# Patient Record
Sex: Female | Born: 1995 | Race: Black or African American | Hispanic: No | Marital: Single | Smoking: Never smoker
Health system: Southern US, Community
[De-identification: ages and names within clinical notes are randomized; demographics above are authoritative.]

## PROBLEM LIST (undated history)

## (undated) DIAGNOSIS — F419 Anxiety disorder, unspecified: Secondary | ICD-10-CM

---

## 2014-01-27 ENCOUNTER — Encounter (HOSPITAL_BASED_OUTPATIENT_CLINIC_OR_DEPARTMENT_OTHER): Payer: Self-pay | Admitting: Emergency Medicine

## 2014-01-27 ENCOUNTER — Emergency Department (HOSPITAL_BASED_OUTPATIENT_CLINIC_OR_DEPARTMENT_OTHER)
Admission: EM | Admit: 2014-01-27 | Discharge: 2014-01-27 | Disposition: A | Discharge status: Home / Self Care | Payer: Medicaid Other | Attending: Emergency Medicine | Admitting: Emergency Medicine

## 2014-01-27 DIAGNOSIS — N390 Urinary tract infection, site not specified: Secondary | ICD-10-CM

## 2014-01-27 DIAGNOSIS — R3915 Urgency of urination: Secondary | ICD-10-CM | POA: Diagnosis present

## 2014-01-27 DIAGNOSIS — Z3202 Encounter for pregnancy test, result negative: Secondary | ICD-10-CM | POA: Diagnosis not present

## 2014-01-27 DIAGNOSIS — R109 Unspecified abdominal pain: Secondary | ICD-10-CM | POA: Diagnosis not present

## 2014-01-27 LAB — PREGNANCY, URINE: PREG TEST UR: NEGATIVE

## 2014-01-27 LAB — URINALYSIS, ROUTINE W REFLEX MICROSCOPIC
Bilirubin Urine: NEGATIVE
Glucose, UA: NEGATIVE mg/dL
Ketones, ur: 15 mg/dL — AB
NITRITE: NEGATIVE
PROTEIN: 100 mg/dL — AB
Specific Gravity, Urine: 1.026 (ref 1.005–1.030)
Urobilinogen, UA: 1 mg/dL (ref 0.0–1.0)
pH: 7 (ref 5.0–8.0)

## 2014-01-27 LAB — URINE MICROSCOPIC-ADD ON

## 2014-01-27 MED ORDER — NITROFURANTOIN MONOHYD MACRO 100 MG PO CAPS: 100.0000 mg | ORAL_CAPSULE | Freq: Two times a day (BID) | ORAL | Status: DC

## 2014-01-27 MED ORDER — NITROFURANTOIN MONOHYD MACRO 100 MG PO CAPS
100.0000 mg | ORAL_CAPSULE | Freq: Once | ORAL | Status: AC
  Administered 2014-01-27: 100 mg via ORAL
  Filled 2014-01-27: qty 1

## 2014-01-27 NOTE — ED Notes (Signed)
MD at bedside. 

## 2014-01-27 NOTE — ED Provider Notes (Addendum)
CSN: [id number]     Arrival date & time 01/27/14  0058 History   First MD Initiated Contact with Patient 01/27/14 0111     Chief Complaint  Patient presents with  . Urinary Urgency     (Consider location/radiation/quality/duration/timing/severity/associated sxs/prior Treatment) Patient is a 18 y.o. female presenting with dysuria. The history is provided by the patient.  Dysuria Pain quality:  Burning and shooting Pain severity:  Moderate Onset quality:  Gradual Duration:  2 days Timing:  Constant Progression:  Worsening Chronicity:  New Recent urinary tract infections: no   Relieved by:  Nothing Exacerbated by: urinating. Ineffective treatments:  None tried Urinary symptoms: frequent urination and hesitancy   Urinary symptoms: no discolored urine and no hematuria   Associated symptoms: abdominal pain   Associated symptoms: no fever, no flank pain, no genital lesions, no nausea, no vaginal discharge and no vomiting   Risk factors: sexually active   Risk factors: no hx of pyelonephritis and no sexually transmitted infections     History reviewed. No pertinent past medical history. History reviewed. No pertinent past surgical history. No family history on file. History  Substance Use Topics  . Smoking status: Never Smoker   . Smokeless tobacco: Not on file  . Alcohol Use: No   OB History    No data available     Review of Systems  Constitutional: Negative for fever.  Gastrointestinal: Positive for abdominal pain. Negative for nausea and vomiting.  Genitourinary: Positive for dysuria. Negative for flank pain and vaginal discharge.  All other systems reviewed and are negative.     Allergies  Review of patient's allergies indicates no known allergies.  Home Medications   Prior to Admission medications   Not on File   BP 127/75 mmHg  Pulse 102  Temp(Src) 98.1 F (36.7 C) (Oral)  Resp 20  Ht 5\' 5"  (1.651 m)  Wt 198 lb (89.812 kg)  BMI 32.95 kg/m2  SpO2  100%  LMP 01/07/2014 (Exact Date) Physical Exam  Constitutional: She is oriented to person, place, and time. She appears well-developed and well-nourished. No distress.  HENT:  Head: Normocephalic and atraumatic.  Eyes: EOM are normal. Pupils are equal, round, and reactive to light.  Cardiovascular: Normal rate, regular rhythm, normal heart sounds and intact distal pulses.  Exam reveals no friction rub.   No murmur heard. Pulmonary/Chest: Effort normal and breath sounds normal. She has no wheezes. She has no rales.  Abdominal: Soft. Bowel sounds are normal. She exhibits no distension. There is tenderness in the suprapubic area. There is no rebound and no guarding.  No flank pain  Musculoskeletal: Normal range of motion. She exhibits no tenderness.  No edema  Neurological: She is alert and oriented to person, place, and time. No cranial nerve deficit.  Skin: Skin is warm and dry. No rash noted.  Psychiatric: She has a normal mood and affect. Her behavior is normal.  Nursing note and vitals reviewed.   ED Course  Procedures (including critical care time) Labs Review Labs Reviewed  URINALYSIS, ROUTINE W REFLEX MICROSCOPIC - Abnormal; Notable for the following:    Color, Urine AMBER (*)    APPearance TURBID (*)    Hgb urine dipstick LARGE (*)    Ketones, ur 15 (*)    Protein, ur 100 (*)    Leukocytes, UA LARGE (*)    All other components within normal limits  URINE MICROSCOPIC-ADD ON - Abnormal; Notable for the following:    Squamous Epithelial /  LPF FEW (*)    Bacteria, UA MANY (*)    All other components within normal limits  PREGNANCY, URINE    Imaging Review No results found.   EKG Interpretation None      MDM   Final diagnoses:  UTI (lower urinary tract infection)    Patient with symptoms consistent with a UTI without systemic symptoms or concerns for pyelonephritis or nephrolithiasis. Patient does not have recurrent UTIs and no history did a concerning for STD.  Patient's urine is consistent with the UTI we'll treat with Leander RamsMacrobid    Willman Cuny, MD 01/27/14 [id number]  Gwyneth SproutWhitney Tammatha Cobb, MD 01/27/14 [id number]

## 2014-01-27 NOTE — ED Notes (Signed)
Bil low abd pain.  Urinary urgency but when she goes either nothing comes out or very little.  Denies dysuria.

## 2014-03-31 ENCOUNTER — Encounter (HOSPITAL_BASED_OUTPATIENT_CLINIC_OR_DEPARTMENT_OTHER): Payer: Self-pay | Admitting: Emergency Medicine

## 2014-03-31 ENCOUNTER — Emergency Department (HOSPITAL_BASED_OUTPATIENT_CLINIC_OR_DEPARTMENT_OTHER)
Admission: EM | Admit: 2014-03-31 | Discharge: 2014-03-31 | Disposition: A | Discharge status: Home / Self Care | Payer: Medicaid Other | Attending: Emergency Medicine | Admitting: Emergency Medicine

## 2014-03-31 DIAGNOSIS — R110 Nausea: Secondary | ICD-10-CM | POA: Diagnosis present

## 2014-03-31 DIAGNOSIS — Z792 Long term (current) use of antibiotics: Secondary | ICD-10-CM | POA: Diagnosis not present

## 2014-03-31 DIAGNOSIS — R197 Diarrhea, unspecified: Secondary | ICD-10-CM

## 2014-03-31 MED ORDER — ONDANSETRON 4 MG PO TBDP
4.0000 mg | ORAL_TABLET | Freq: Once | ORAL | Status: AC
  Administered 2014-03-31: 4 mg via ORAL
  Filled 2014-03-31: qty 1

## 2014-03-31 MED ORDER — ONDANSETRON 4 MG PO TBDP: ORAL_TABLET | ORAL | Status: DC

## 2014-03-31 NOTE — ED Notes (Signed)
Pt states last night around & she started having N/D

## 2014-03-31 NOTE — Discharge Instructions (Signed)

## 2014-03-31 NOTE — ED Provider Notes (Signed)
CSN: [id number]     Arrival date & time 03/31/14  1934 History   This chart was scribed for Mirian Mo, MD by Charline Bills, ED Scribe. The patient was seen in room MH04/MH04. Patient's care was started at 7:47 PM.   Chief Complaint  Patient presents with  . Nausea  . Diarrhea   The history is provided by the patient. No language interpreter was used.   HPI Comments: Claudia Webb is a 18 y.o. female who presents to the Emergency Department complaining of multiple episodes of diarrhea since 7 PM last night. Pt describes the quality as watery. She reports associated nausea that she describes as constant. Pt denies vomiting, fever, abdominal pain, dysuria, vaginal bleeding, vaginal discharge. Pt also denies change in food. No sick contacts.  History reviewed. No pertinent past medical history. History reviewed. No pertinent past surgical history. History reviewed. No pertinent family history. History  Substance Use Topics  . Smoking status: Never Smoker   . Smokeless tobacco: Not on file  . Alcohol Use: No   OB History    No data available     Review of Systems  Constitutional: Negative for fever.  Gastrointestinal: Positive for nausea and diarrhea. Negative for vomiting and abdominal pain.  Genitourinary: Negative for dysuria, vaginal bleeding and vaginal discharge.  All other systems reviewed and are negative.  Allergies  Review of patient's allergies indicates no known allergies.  Home Medications   Prior to Admission medications   Medication Sig Start Date End Date Taking? Authorizing Provider  nitrofurantoin, macrocrystal-monohydrate, (MACROBID) 100 MG capsule Take 1 capsule (100 mg total) by mouth 2 (two) times daily. 01/27/14   Gwyneth Sprout, MD  ondansetron (ZOFRAN ODT) 4 MG disintegrating tablet  ODT q4 hours prn nausea/vomit 03/31/14   Mirian Mo, MD   BP 130/80 mmHg  Pulse 83  Temp(Src) 98.6 F (37 C) (Oral)  Resp 18  Ht  (1.626 m)  Wt 190 lb  (86.183 kg)  BMI 32.60 kg/m2  SpO2 100%  LMP 03/29/2014 Physical Exam  Constitutional: She is oriented to person, place, and time. She appears well-developed and well-nourished.  HENT:  Head: Normocephalic and atraumatic.  Right Ear: External ear normal.  Left Ear: External ear normal.  Eyes: Conjunctivae and EOM are normal. Pupils are equal, round, and reactive to light.  Neck: Normal range of motion. Neck supple.  Cardiovascular: Normal rate, regular rhythm, normal heart sounds and intact distal pulses.   Pulmonary/Chest: Effort normal and breath sounds normal.  Abdominal: Soft. Bowel sounds are normal. There is no tenderness.  Musculoskeletal: Normal range of motion.  Neurological: She is alert and oriented to person, place, and time.  Skin: Skin is warm and dry.  Vitals reviewed.   ED Course  Procedures (including critical care time) DIAGNOSTIC STUDIES: Oxygen Saturation is 100% on RA, normal by my interpretation.    COORDINATION OF CARE: 7:51-Discussed treatment plan which includes Zofran and return precautions with pt at bedside and pt agreed to plan.   Labs Review Labs Reviewed - No data to display  Imaging Review No results found.   EKG Interpretation None      MDM   Final diagnoses:  Nausea  Diarrhea  Diarrhea in adult patient    19 y.o. female without pertinent PMH presents with diarrhea and nausea x 1 day.  No vomiting, sick contacts, other concerning historical features. On arrival vital signs and physical exam as above. No abdominal tenderness. Patient is tolerating by mouth intake  without difficulty. Likely etiology viral gastroenteritis. Discharged home in stable condition with strict return precautions..    I have reviewed all laboratory and imaging studies if ordered as above  1. Nausea   2. Diarrhea   3. Diarrhea in adult patient       I personally performed the services described in this documentation, which was scribed in my presence. The  recorded information has been reviewed and is accurate.    Mirian MoMatthew Gentry, MD 03/31/14 2009

## 2014-05-09 ENCOUNTER — Emergency Department (HOSPITAL_BASED_OUTPATIENT_CLINIC_OR_DEPARTMENT_OTHER)
Admission: EM | Admit: 2014-05-09 | Discharge: 2014-05-09 | Disposition: A | Discharge status: Home / Self Care | Payer: Medicaid Other | Attending: Emergency Medicine | Admitting: Emergency Medicine

## 2014-05-09 ENCOUNTER — Encounter (HOSPITAL_BASED_OUTPATIENT_CLINIC_OR_DEPARTMENT_OTHER): Payer: Self-pay | Admitting: Emergency Medicine

## 2014-05-09 DIAGNOSIS — J029 Acute pharyngitis, unspecified: Secondary | ICD-10-CM | POA: Diagnosis present

## 2014-05-09 DIAGNOSIS — Z79899 Other long term (current) drug therapy: Secondary | ICD-10-CM | POA: Insufficient documentation

## 2014-05-09 DIAGNOSIS — Z793 Long term (current) use of hormonal contraceptives: Secondary | ICD-10-CM | POA: Insufficient documentation

## 2014-05-09 LAB — RAPID STREP SCREEN (MED CTR MEBANE ONLY): Streptococcus, Group A Screen (Direct): NEGATIVE

## 2014-05-09 MED ORDER — IBUPROFEN 600 MG PO TABS: 600.0000 mg | ORAL_TABLET | Freq: Four times a day (QID) | ORAL | Status: DC | PRN

## 2014-05-09 MED ORDER — IBUPROFEN 800 MG PO TABS
800.0000 mg | ORAL_TABLET | Freq: Once | ORAL | Status: AC
  Administered 2014-05-09: 800 mg via ORAL
  Filled 2014-05-09: qty 1

## 2014-05-09 NOTE — Discharge Instructions (Signed)
Please read and follow all provided instructions.  Your diagnoses today include:  1. Pharyngitis     Tests performed today include:  Strep test: was negative for strep throat  Strep culture: you will be notified if this comes back positive  Vital signs. See below for your results today.   Medications prescribed:   Ibuprofen (Motrin, Advil) - anti-inflammatory pain medication  Do not exceed 600mg  ibuprofen every 6 hours, take with food  You have been prescribed an anti-inflammatory medication or NSAID. Take with food. Take smallest effective dose for the shortest duration needed for your pain. Stop taking if you experience stomach pain or vomiting.    Home care instructions:  Please read the educational materials provided and follow any instructions contained in this packet.  Follow-up instructions: Please follow-up with your primary care provider as needed for further evaluation of your symptoms.  Return instructions:   Please return to the Emergency Department if you experience worsening symptoms.   Return if you have worsening problems swallowing, your neck becomes swollen, you cannot swallow your saliva or your voice becomes muffled.   Return with high persistent fever, persistent vomiting, or if you have trouble breathing.   Please return if you have any other emergent concerns.  Additional Information:  Your vital signs today were: BP 119/65 mmHg   Pulse 83   Temp(Src) 98.8 F (37.1 C) (Oral)   Resp 16   SpO2 100%   LMP 04/16/2014 If your blood pressure (BP) was elevated above 135/85 this visit, please have this repeated by your doctor within one month. --------------

## 2014-05-09 NOTE — ED Provider Notes (Signed)
CSN: [id number]     Arrival date & time 05/09/14  2121 History   First MD Initiated Contact with Patient 05/09/14 2118     Chief Complaint  Patient presents with  . Sore Throat     (Consider location/radiation/quality/duration/timing/severity/associated sxs/prior Treatment) HPI Comments: Patient presents with approximately 12 hour history of sore throat. Patient states that her throat is more sore on the right side. She has had nasal congestion recently but denies other symptoms including cough. No fever. Patient has not taken any medications for this prior to arrival.  Patient is a 19 y.o. female presenting with pharyngitis. The history is provided by the patient.  Sore Throat Associated symptoms include congestion and a sore throat. Pertinent negatives include no abdominal pain, chills, coughing, fatigue, fever, headaches, myalgias, nausea, rash or vomiting.    History reviewed. No pertinent past medical history. History reviewed. No pertinent past surgical history. History reviewed. No pertinent family history. History  Substance Use Topics  . Smoking status: Never Smoker   . Smokeless tobacco: Never Used  . Alcohol Use: No   OB History    No data available     Review of Systems  Constitutional: Negative for fever, chills and fatigue.  HENT: Positive for congestion and sore throat. Negative for ear pain, rhinorrhea and sinus pressure.   Eyes: Negative for redness.  Respiratory: Negative for cough and wheezing.   Gastrointestinal: Negative for nausea, vomiting, abdominal pain and diarrhea.  Genitourinary: Negative for dysuria.  Musculoskeletal: Negative for myalgias and neck stiffness.  Skin: Negative for rash.  Neurological: Negative for headaches.  Hematological: Positive for adenopathy.      Allergies  Review of patient's allergies indicates no known allergies.  Home Medications   Prior to Admission medications   Medication Sig Start Date End Date Taking?  Authorizing Provider  nitrofurantoin, macrocrystal-monohydrate, (MACROBID) 100 MG capsule Take 1 capsule (100 mg total) by mouth 2 (two) times daily. 01/27/14   Gwyneth SproutWhitney Plunkett, MD  ondansetron (ZOFRAN ODT) 4 MG disintegrating tablet 4mg  ODT q4 hours prn nausea/vomit 03/31/14   Mirian MoMatthew Gentry, MD   BP 119/65 mmHg  Pulse 83  Temp(Src) 98.8 F (37.1 C) (Oral)  Resp 16  SpO2 100%  LMP 04/16/2014   Physical Exam  Constitutional: She appears well-developed and well-nourished.  HENT:  Head: Normocephalic and atraumatic.  Right Ear: Tympanic membrane, external ear and ear canal normal.  Left Ear: Tympanic membrane, external ear and ear canal normal.  Nose: Nose normal. No mucosal edema or rhinorrhea.  Mouth/Throat: Uvula is midline and mucous membranes are normal. Mucous membranes are not dry. No oral lesions. No trismus in the jaw. No uvula swelling. Posterior oropharyngeal erythema present. No oropharyngeal exudate, posterior oropharyngeal edema or tonsillar abscesses.  Eyes: Conjunctivae are normal. Right eye exhibits no discharge. Left eye exhibits no discharge.  Neck: Normal range of motion. Neck supple.  Cardiovascular: Normal rate, regular rhythm and normal heart sounds.   Pulmonary/Chest: Effort normal and breath sounds normal. No respiratory distress. She has no wheezes. She has no rales.  Abdominal: Soft. There is no tenderness.  Lymphadenopathy:    She has cervical adenopathy.  Neurological: She is alert.  Skin: Skin is warm and dry.  Psychiatric: She has a normal mood and affect.  Nursing note and vitals reviewed.   ED Course  Procedures (including critical care time) Labs Review Labs Reviewed  RAPID STREP SCREEN    Imaging Review No results found.   EKG Interpretation None  9:59 PM Patient seen and examined. Work-up initiated. Medications ordered.   Vital signs reviewed and are as follows: BP 119/65 mmHg  Pulse 83  Temp(Src) 98.8 F (37.1 C) (Oral)   Resp 16  SpO2 100%  LMP 04/16/2014  10:18 PM Patient informed of negative strep result. Patient counseled on supportive care for viral URI and s/s to return including worsening symptoms, persistent fever, persistent vomiting, or if they have any other concerns. Urged to see PCP if symptoms persist for more than 3 days. Patient verbalizes understanding and agrees with plan.    MDM   Final diagnoses:  Pharyngitis    [facility] 2/4, strep test negative. Patient counseled on supportive treatment for likely viral pharyngitis. No sign of peritonsillar abscess. Do not suspect retropharyngeal abscess. Patient appears well, nontoxic. Discharged home with NSAIDs and symptomatic treatment.    Renne Crigler, PA-C 05/09/14 2219  Rolland Porter, MD 05/10/14 2123

## 2014-05-09 NOTE — ED Notes (Signed)
Pt alert, NAD, calm, interactive, skin W&D, resps e/u, no dyspnea noted, c/o sore throat, onset this am, (denies: cough, congestion, cold sx, sob, fever, nvd, dizziness, earache or facial pain or other sx), no meds PTA, speech altered, tonsils swollen.

## 2014-05-09 NOTE — ED Notes (Signed)
NAD, calm, alert, interactive, [facility] given [facility] with motrin.

## 2014-05-09 NOTE — ED Notes (Signed)
Pt report sore throat that worsened through out the day onset this AM

## 2014-05-09 NOTE — ED Notes (Signed)
Given snack. 

## 2014-05-11 LAB — CULTURE, GROUP A STREP

## 2014-06-14 ENCOUNTER — Encounter (HOSPITAL_BASED_OUTPATIENT_CLINIC_OR_DEPARTMENT_OTHER): Payer: Self-pay

## 2014-06-14 ENCOUNTER — Emergency Department (HOSPITAL_BASED_OUTPATIENT_CLINIC_OR_DEPARTMENT_OTHER)
Admission: EM | Admit: 2014-06-14 | Discharge: 2014-06-14 | Disposition: A | Discharge status: Home / Self Care | Payer: Medicaid Other | Attending: Emergency Medicine | Admitting: Emergency Medicine

## 2014-06-14 DIAGNOSIS — Z3490 Encounter for supervision of normal pregnancy, unspecified, unspecified trimester: Secondary | ICD-10-CM

## 2014-06-14 DIAGNOSIS — Z331 Pregnant state, incidental: Secondary | ICD-10-CM | POA: Insufficient documentation

## 2014-06-14 DIAGNOSIS — N390 Urinary tract infection, site not specified: Secondary | ICD-10-CM | POA: Diagnosis not present

## 2014-06-14 DIAGNOSIS — R1013 Epigastric pain: Secondary | ICD-10-CM | POA: Diagnosis present

## 2014-06-14 DIAGNOSIS — K219 Gastro-esophageal reflux disease without esophagitis: Secondary | ICD-10-CM | POA: Insufficient documentation

## 2014-06-14 DIAGNOSIS — Z3201 Encounter for pregnancy test, result positive: Secondary | ICD-10-CM | POA: Diagnosis not present

## 2014-06-14 LAB — URINALYSIS, ROUTINE W REFLEX MICROSCOPIC
Glucose, UA: NEGATIVE mg/dL
Hgb urine dipstick: NEGATIVE
Ketones, ur: 15 mg/dL — AB
Nitrite: NEGATIVE
PH: 6 (ref 5.0–8.0)
Protein, ur: NEGATIVE mg/dL
SPECIFIC GRAVITY, URINE: 1.028 (ref 1.005–1.030)
Urobilinogen, UA: 1 mg/dL (ref 0.0–1.0)

## 2014-06-14 LAB — URINE MICROSCOPIC-ADD ON

## 2014-06-14 LAB — PREGNANCY, URINE: Preg Test, Ur: POSITIVE — AB

## 2014-06-14 MED ORDER — CEPHALEXIN 250 MG PO CAPS: 250.0000 mg | ORAL_CAPSULE | Freq: Four times a day (QID) | ORAL | Status: DC

## 2014-06-14 NOTE — ED Notes (Signed)
Patient here with epigastric pain x 3 days, reports that the pain is worse when she lays down. Nausea with same

## 2014-06-14 NOTE — ED Provider Notes (Signed)
CSN: [id number]     Arrival date & time 06/14/14  1524 History  This chart was scribed for Margarita Grizzle, MD by Abel Presto, ED Scribe. This patient was seen in room MH06/MH06 and the patient's care was started at 5:38 PM.    Chief Complaint  Patient presents with  . Abdominal Pain     Patient is a 19 y.o. female presenting with abdominal pain. The history is provided by the patient. No language interpreter was used.  Abdominal Pain Associated symptoms: no dysuria    HPI Comments: Claudia Webb is a 19 y.o. female who presents to the Emergency Department complaining of intermittent epigastric pain with onset 3 days ago. Pt states it does not feel like reflux. Pt note laying down aggravates the pain.  Pt denies any changes in diet. Pt's LNMP was 02/23. G/P/A is 1/1/0. Pt was on depo, noting last shot was last year.  Pt is not a smoker and denies EtOH use. Pt denies dysuria, urinary frequency, and unexpected changes in weight.   History reviewed. No pertinent past medical history. History reviewed. No pertinent past surgical history. No family history on file. History  Substance Use Topics  . Smoking status: Never Smoker   . Smokeless tobacco: Never Used  . Alcohol Use: No   OB History    No data available     Review of Systems  Constitutional: Negative for unexpected weight change.  Gastrointestinal: Positive for abdominal pain.  Genitourinary: Negative for dysuria and frequency.  All other systems reviewed and are negative.     Allergies  Review of patient's allergies indicates no known allergies.  Home Medications   Prior to Admission medications   Not on File   BP 115/61 mmHg  Pulse 96  Temp(Src) 98.7 F (37.1 C) (Oral)  Resp 18  Ht  (1.626 m)  Wt 190 lb (86.183 kg)  BMI 32.60 kg/m2  SpO2 100%  LMP 05/24/2014 Physical Exam  Constitutional: She is oriented to person, place, and time. She appears well-developed and well-nourished. No distress.  HENT:   Head: Normocephalic and atraumatic.  Right Ear: External ear normal.  Left Ear: External ear normal.  Nose: Nose normal.  Mouth/Throat: Oropharynx is clear and moist.  Eyes: Conjunctivae and EOM are normal. Pupils are equal, round, and reactive to light.  Neck: Normal range of motion. Neck supple.  Cardiovascular: Normal rate and regular rhythm.   Pulmonary/Chest: Effort normal and breath sounds normal.  Abdominal: Soft. She exhibits no distension and no mass. There is no tenderness. There is no rebound and no guarding.  Musculoskeletal: Normal range of motion.  Neurological: She is alert and oriented to person, place, and time.  Skin: Skin is warm and dry.  Psychiatric: She has a normal mood and affect. Her behavior is normal. Judgment and thought content normal.  Nursing note and vitals reviewed.   ED Course  Procedures (including critical care time) DIAGNOSTIC STUDIES: Oxygen Saturation is 100% on room air, normal by my interpretation.    COORDINATION OF CARE: 5:52 PM Discussed treatment plan with patient at beside including Abx for UTI, the patient agrees with the plan and has no further questions at this time.   Labs Review Labs Reviewed  URINALYSIS, ROUTINE W REFLEX MICROSCOPIC - Abnormal; Notable for the following:    Bilirubin Urine SMALL (*)    Ketones, ur 15 (*)    Leukocytes, UA SMALL (*)    All other components within normal limits  PREGNANCY, URINE -  Abnormal; Notable for the following:    Preg Test, Ur POSITIVE (*)    All other components within normal limits  URINE MICROSCOPIC-ADD ON - Abnormal; Notable for the following:    Bacteria, UA MANY (*)    All other components within normal limits    Imaging Review No results found.   EKG Interpretation None      Results for orders placed or performed during the hospital encounter of 06/14/14  Urinalysis, Routine w reflex microscopic  Result Value Ref Range   Color, Urine YELLOW YELLOW   APPearance CLEAR  CLEAR   Specific Gravity, Urine 1.028 1.005 - 1.030   pH 6.0 5.0 - 8.0   Glucose, UA NEGATIVE NEGATIVE mg/dL   Hgb urine dipstick NEGATIVE NEGATIVE   Bilirubin Urine SMALL (A) NEGATIVE   Ketones, ur 15 (A) NEGATIVE mg/dL   Protein, ur NEGATIVE NEGATIVE mg/dL   Urobilinogen, UA 1.0 0.0 - 1.0 mg/dL   Nitrite NEGATIVE NEGATIVE   Leukocytes, UA SMALL (A) NEGATIVE  Pregnancy, urine  Result Value Ref Range   Preg Test, Ur POSITIVE (A) NEGATIVE  Urine microscopic-add on  Result Value Ref Range   Squamous Epithelial / LPF RARE RARE   WBC, UA 3-6 <3 WBC/hpf   Bacteria, UA MANY (A) RARE   Urine-Other MUCOUS PRESENT    No results found.   MDM   Final diagnoses:  Gastroesophageal reflux disease, esophagitis presence not specified  Pregnant  UTI (lower urinary tract infection)    I personally performed the services described in this documentation, which was scribed in my presence. The recorded information has been reviewed and considered.       Margarita Grizzleanielle Demitris Pokorny, MD 06/14/14 1800

## 2014-06-14 NOTE — Discharge Instructions (Signed)
Heartburn During Pregnancy  °Heartburn is a burning sensation in the chest caused by stomach acid backing up into the esophagus. Heartburn is common in pregnancy because a certain hormone (progesterone) is released when a woman is pregnant. The progesterone hormone may relax the valve that separates the esophagus from the stomach. This allows acid to go up into the esophagus, causing heartburn. Heartburn may also happen in pregnancy because the enlarging uterus pushes up on the stomach, which pushes more acid into the esophagus. This is especially true in the later stages of pregnancy. Heartburn problems usually go away after giving birth. °CAUSES  °Heartburn is caused by stomach acid backing up into the esophagus. During pregnancy, this may result from various things, including:  °· The progesterone hormone. °· Changing hormone levels. °· The growing uterus pushing stomach acid upward. °· Large meals. °· Certain foods and drinks. °· Exercise. °· Increased acid production. °SIGNS AND SYMPTOMS  °· Burning pain in the chest or lower throat. °· Bitter taste in the mouth. °· Coughing. °DIAGNOSIS  °Your health care provider will typically diagnose heartburn by taking a careful history of your concern. Blood tests may be done to check for a certain type of bacteria that is associated with heartburn. Sometimes, heartburn is diagnosed by prescribing a heartburn medicine to see if the symptoms improve. In some cases, a procedure called an endoscopy may be done. In this procedure, a tube with a light and a camera on the end (endoscope) is used to examine the esophagus and the stomach. °TREATMENT  °Treatment will vary depending on the severity of your symptoms. Your health care provider may recommend: °· Over-the-counter medicines (antacids, acid reducers) for mild heartburn. °· Prescription medicines to decrease stomach acid or to protect your stomach lining. °· Certain changes in your diet. °· Elevating the head of your bed  by putting blocks under the legs. This helps prevent stomach acid from backing up into the esophagus when you are lying down. °HOME CARE INSTRUCTIONS  °· Only take over-the-counter or prescription medicines as directed by your health care provider. °· Raise the head of your bed by putting blocks under the legs if instructed to do so by your health care provider. Sleeping with more pillows is not effective because it only changes the position of your head. °· Do not exercise right after eating. °· Avoid eating 2-3 hours before bed. Do not lie down right after eating. °· Eat small meals throughout the day instead of three large meals. °· Identify foods and beverages that make your symptoms worse and avoid them. Foods you may want to avoid include: °¨ Peppers. °¨ Chocolate. °¨ High-fat foods, including fried foods. °¨ Spicy foods. °¨ Garlic and onions. °¨ Citrus fruits, including oranges, grapefruit, lemons, and limes. °¨ Food containing tomatoes or tomato products. °¨ Mint. °¨ Carbonated and caffeinated drinks. °¨ Vinegar. °SEEK MEDICAL CARE IF: °· You have abdominal pain of any kind. °· You feel burning in your upper abdomen or chest, especially after eating or lying down. °· You have nausea and vomiting. °· Your stomach feels upset after you eat. °SEEK IMMEDIATE MEDICAL CARE IF:  °· You have severe chest pain that goes down your arm or into your jaw or neck. °· You feel sweaty, dizzy, or light-headed. °· You become short of breath. °· You vomit blood. °· You have difficulty or pain with swallowing. °· You have bloody or black, tarry stools. °· You have episodes of heartburn more than 3 times a   week, for more than 2 weeks. MAKE SURE YOU:  Understand these instructions.  Will watch your condition.  Will get help right away if you are not doing well or get worse. Document Released: 03/10/2000 Document Revised: 03/18/2013 Document Reviewed: 10/30/2012 [facility] Patient Information 2015 [place], [place]. This  information is not intended to replace advice given to you by your health care provider. Make sure you discuss any questions you have with your health care provider. Gastroesophageal Reflux Disease, Adult Gastroesophageal reflux disease (GERD) happens when acid from your stomach flows up into the esophagus. When acid comes in contact with the esophagus, the acid causes soreness (inflammation) in the esophagus. Over time, GERD may create small holes (ulcers) in the lining of the esophagus. CAUSES   Increased body weight. This puts pressure on the stomach, making acid rise from the stomach into the esophagus.  Smoking. This increases acid production in the stomach.  Drinking alcohol. This causes decreased pressure in the lower esophageal sphincter (valve or ring of muscle between the esophagus and stomach), allowing acid from the stomach into the esophagus.  Late evening meals and a full stomach. This increases pressure and acid production in the stomach.  A malformed lower esophageal sphincter. Sometimes, no cause is found. SYMPTOMS   Burning pain in the lower part of the mid-chest behind the breastbone and in the mid-stomach area. This may occur twice a week or more often.  Trouble swallowing.  Sore throat.  Dry cough.  Asthma-like symptoms including chest tightness, shortness of breath, or wheezing. DIAGNOSIS  Your caregiver may be able to diagnose GERD based on your symptoms. In some cases, X-rays and other tests may be done to check for complications or to check the condition of your stomach and esophagus. TREATMENT  Your caregiver may recommend over-the-counter or prescription medicines to help decrease acid production. Ask your caregiver before starting or adding any new medicines.  HOME CARE INSTRUCTIONS   Change the factors that you can control. Ask your caregiver for guidance concerning weight loss, quitting smoking, and alcohol consumption.  Avoid foods and drinks that make  your symptoms worse, such as:  Caffeine or alcoholic drinks.  Chocolate.  Peppermint or mint flavorings.  Garlic and onions.  Spicy foods.  Citrus fruits, such as oranges, lemons, or limes.  Tomato-based foods such as sauce, chili, salsa, and pizza.  Fried and fatty foods.  Avoid lying down for the 3 hours prior to your bedtime or prior to taking a nap.  Eat small, frequent meals instead of large meals.  Wear loose-fitting clothing. Do not wear anything tight around your waist that causes pressure on your stomach.  Raise the head of your bed 6 to 8 inches with wood blocks to help you sleep. Extra pillows will not help.  Only take over-the-counter or prescription medicines for pain, discomfort, or fever as directed by your caregiver.  Do not take aspirin, ibuprofen, or other nonsteroidal anti-inflammatory drugs (NSAIDs). SEEK IMMEDIATE MEDICAL CARE IF:   You have pain in your arms, neck, jaw, teeth, or back.  Your pain increases or changes in intensity or duration.  You develop nausea, vomiting, or sweating (diaphoresis).  You develop shortness of breath, or you faint.  Your vomit is green, yellow, black, or looks like coffee grounds or blood.  Your stool is red, bloody, or black. These symptoms could be signs of other problems, such as heart disease, gastric bleeding, or esophageal bleeding. MAKE SURE YOU:   Understand these instructions.  Will  watch your condition.  Will get help right away if you are not doing well or get worse. Document Released: 12/21/2004 Document Revised: 06/05/2011 Document Reviewed: 09/30/2010 [facility] Patient Information 2015 [place]. This information is not intended to replace advice given to you by your health care provider. Make sure you discuss any questions you have with your health care provider. Urinary Tract Infection Urinary tract infections (UTIs) can develop anywhere along your urinary tract. Your urinary tract is your  body's drainage system for removing wastes and extra water. Your urinary tract includes two kidneys, two ureters, a bladder, and a urethra. Your kidneys are a pair of bean-shaped organs. Each kidney is about the size of your fist. They are located below your ribs, one on each side of your spine. CAUSES Infections are caused by microbes, which are microscopic organisms, including fungi, viruses, and bacteria. These organisms are so small that they can only be seen through a microscope. Bacteria are the microbes that most commonly cause UTIs. SYMPTOMS  Symptoms of UTIs may vary by age and gender of the patient and by the location of the infection. Symptoms in young women typically include a frequent and intense urge to urinate and a painful, burning feeling in the bladder or urethra during urination. Older women and men are more likely to be tired, shaky, and weak and have muscle aches and abdominal pain. A fever may mean the infection is in your kidneys. Other symptoms of a kidney infection include pain in your back or sides below the ribs, nausea, and vomiting. DIAGNOSIS To diagnose a UTI, your caregiver will ask you about your symptoms. Your caregiver also will ask to provide a urine sample. The urine sample will be tested for bacteria and white blood cells. White blood cells are made by your body to help fight infection. TREATMENT  Typically, UTIs can be treated with medication. Because most UTIs are caused by a bacterial infection, they usually can be treated with the use of antibiotics. The choice of antibiotic and length of treatment depend on your symptoms and the type of bacteria causing your infection. HOME CARE INSTRUCTIONS  If you were prescribed antibiotics, take them exactly as your caregiver instructs you. Finish the medication even if you feel better after you have only taken some of the medication.  Drink enough water and fluids to keep your urine clear or pale yellow.  Avoid caffeine,  tea, and carbonated beverages. They tend to irritate your bladder.  Empty your bladder often. Avoid holding urine for long periods of time.  Empty your bladder before and after sexual intercourse.  After a bowel movement, women should cleanse from front to back. Use each tissue only once. SEEK MEDICAL CARE IF:   You have back pain.  You develop a fever.  Your symptoms do not begin to resolve within 3 days. SEEK IMMEDIATE MEDICAL CARE IF:   You have severe back pain or lower abdominal pain.  You develop chills.  You have nausea or vomiting.  You have continued burning or discomfort with urination. MAKE SURE YOU:   Understand these instructions.  Will watch your condition.  Will get help right away if you are not doing well or get worse. Document Released: 12/21/2004 Document Revised: 09/12/2011 Document Reviewed: 04/21/2011 [facility] Patient Information 2015 [place], [place]. This information is not intended to replace advice given to you by your health care provider. Make sure you discuss any questions you have with your health care provider.

## 2015-01-18 ENCOUNTER — Encounter (HOSPITAL_BASED_OUTPATIENT_CLINIC_OR_DEPARTMENT_OTHER): Payer: Self-pay | Admitting: *Deleted

## 2015-01-18 ENCOUNTER — Emergency Department (HOSPITAL_BASED_OUTPATIENT_CLINIC_OR_DEPARTMENT_OTHER): Payer: Medicaid Other

## 2015-01-18 ENCOUNTER — Emergency Department (HOSPITAL_BASED_OUTPATIENT_CLINIC_OR_DEPARTMENT_OTHER)
Admission: EM | Admit: 2015-01-18 | Discharge: 2015-01-19 | Disposition: A | Discharge status: Home / Self Care | Payer: Medicaid Other | Attending: Emergency Medicine | Admitting: Emergency Medicine

## 2015-01-18 DIAGNOSIS — Z3A35 35 weeks gestation of pregnancy: Secondary | ICD-10-CM | POA: Insufficient documentation

## 2015-01-18 DIAGNOSIS — Z792 Long term (current) use of antibiotics: Secondary | ICD-10-CM | POA: Diagnosis not present

## 2015-01-18 DIAGNOSIS — O9989 Other specified diseases and conditions complicating pregnancy, childbirth and the puerperium: Secondary | ICD-10-CM | POA: Diagnosis present

## 2015-01-18 DIAGNOSIS — R000 Tachycardia, unspecified: Secondary | ICD-10-CM | POA: Insufficient documentation

## 2015-01-18 DIAGNOSIS — O9279 Other disorders of lactation: Secondary | ICD-10-CM | POA: Diagnosis not present

## 2015-01-18 DIAGNOSIS — R0602 Shortness of breath: Secondary | ICD-10-CM | POA: Insufficient documentation

## 2015-01-18 DIAGNOSIS — N644 Mastodynia: Secondary | ICD-10-CM

## 2015-01-18 DIAGNOSIS — R079 Chest pain, unspecified: Secondary | ICD-10-CM

## 2015-01-18 LAB — CBC WITH DIFFERENTIAL/PLATELET
BASOS PCT: 0 %
Basophils Absolute: 0 10*3/uL (ref 0.0–0.1)
Eosinophils Absolute: 0.1 10*3/uL (ref 0.0–0.7)
Eosinophils Relative: 1 %
HCT: 30.7 % — ABNORMAL LOW (ref 36.0–46.0)
HEMOGLOBIN: 9.4 g/dL — AB (ref 12.0–15.0)
Lymphocytes Relative: 16 %
Lymphs Abs: 1.3 10*3/uL (ref 0.7–4.0)
MCH: 22.7 pg — AB (ref 26.0–34.0)
MCHC: 30.6 g/dL (ref 30.0–36.0)
MCV: 74.2 fL — ABNORMAL LOW (ref 78.0–100.0)
MONO ABS: 0.6 10*3/uL (ref 0.1–1.0)
Monocytes Relative: 7 %
Neutro Abs: 6.3 10*3/uL (ref 1.7–7.7)
Neutrophils Relative %: 76 %
PLATELETS: 220 10*3/uL (ref 150–400)
RBC: 4.14 MIL/uL (ref 3.87–5.11)
RDW: 15.2 % (ref 11.5–15.5)
WBC: 8.3 10*3/uL (ref 4.0–10.5)

## 2015-01-18 LAB — BASIC METABOLIC PANEL
Anion gap: 6 (ref 5–15)
BUN: 5 mg/dL — AB (ref 6–20)
CHLORIDE: 107 mmol/L (ref 101–111)
CO2: 22 mmol/L (ref 22–32)
Calcium: 8.5 mg/dL — ABNORMAL LOW (ref 8.9–10.3)
Creatinine, Ser: 0.47 mg/dL (ref 0.44–1.00)
GFR calc Af Amer: >60 mL/min (ref >60)
GFR calc non Af Amer: >60 mL/min (ref >60)
Glucose, Bld: 133 mg/dL — ABNORMAL HIGH (ref 65–99)
POTASSIUM: 3.3 mmol/L — AB (ref 3.5–5.1)
Sodium: 135 mmol/L (ref 135–145)

## 2015-01-18 LAB — D-DIMER, QUANTITATIVE: D-Dimer, Quant: 1.15 ug/mL-FEU — ABNORMAL HIGH (ref 0.00–0.48)

## 2015-01-18 MED ORDER — SODIUM CHLORIDE 0.9 % IV SOLN
Freq: Once | INTRAVENOUS | Status: AC
  Administered 2015-01-18: 22:00:00 via INTRAVENOUS

## 2015-01-18 MED ORDER — SODIUM CHLORIDE 0.9 % IV BOLUS (SEPSIS)
1000.0000 mL | Freq: Once | INTRAVENOUS | Status: AC
  Administered 2015-01-18: 1000 mL via INTRAVENOUS

## 2015-01-18 NOTE — Progress Notes (Signed)
RROB reviewed fetal [facility] and [facility]; Dr Adrian BlackwaterStinson called and made aware of patient status, Pt is G1P0 at 35 4/7 weeks complaining of left sided upper chest pain and some low back pain in left side; pt refers to pain as "breast pain" per RN taking care of her; FHR reactive and only UI noted on toco; patient denies bleeding or leaking of fluid or any concerns related to pregnancy; [facility] [place]; at this time Dr Adrian BlackwaterStinson is clearing patient obstetrically; Misty RN taking care of patient notified patient is able to be taken off monitor and we are clearing her obstetrically at this time and to call back with any other concerns if needed.

## 2015-01-18 NOTE — ED Notes (Addendum)
[redacted] weeks pregnant. Lower abdominal pain radiating into her lower back. States pain comes and goes and feels like menstrual cramps. No vaginal discharge or leaking. The pain has been on and off for 2 weeks. She is also having soreness to her left breast.

## 2015-01-18 NOTE — ED Notes (Signed)
[facility] Radiumhristy RN with OB response about the Pt. And gave a quick report on the Pt.

## 2015-01-18 NOTE — ED Notes (Signed)
Rapid Response RN Neysa BonitoChristy called to inform that the attending is good with the monitoring of the fetus and all is well.  [facility] Per Electronic Data SystemsChristy RN... RN Earlene PlaterDavis will fax again monitoring strips to womens per the request of Rapid Response Nurse.

## 2015-01-18 NOTE — Progress Notes (Signed)
RROB spoke with Reita ClicheBobby, Consulting civil engineerCharge [facility], RROB was unable to admit pt to obix, therefore is unable to monitor and document on the fhr until a fax is sent of the fhr strip. Reita ClicheBobby, RN is contacting IT to resolve problem; RN will fax copies of fhr strip every [phone] and call if there are any questions or concerns at [phone].

## 2015-01-18 NOTE — ED Notes (Signed)
Pt. Reports she has had pain in her upper L breast area since yesterday evening.  Pt. Now reports she has had lower abd. Pain and low back pain  With some abd. Cramping per pt.    No reports of bleeding per Pt. No spotting per Pt.   Last visit to OB Dr.was on Oct. 17th per Pt.    Pt. Placed on TOCO Monitor   Delivery date is Nov. 24 2016  Pt. Reports this is her second pregnancy and will be her second delivery.

## 2015-01-19 ENCOUNTER — Emergency Department (HOSPITAL_BASED_OUTPATIENT_CLINIC_OR_DEPARTMENT_OTHER): Payer: Medicaid Other

## 2015-01-19 NOTE — ED Provider Notes (Signed)
CSN: [id number]     Arrival date & time 01/18/15  1851 History   First MD Initiated Contact with Patient 01/18/15 1915     Chief Complaint  Patient presents with  . Abdominal Pain  . [redacted] weeks pregnant     (Consider location/radiation/quality/duration/timing/severity/associated sxs/prior Treatment) HPI Comments: 19 y.o. Female G2P1 at [redacted] weeks gestation presents for left chest wall pain.  Although nursing note states patient said she was coming for lower abdominal pain she says that she was presenting about pain in her left chest wall at the top of her breast and that when asked about her abdomen she told them about intermittent cramping that she gets from time to time.  She denies any acute, sharp, or severe abdominal pain.  No vaginal discharge or bleeding.  Normal urination.  She reports that she has noted enlargement of her breast and that there is an area of pain that she can reproduce on palpation when feeling the area.  She denies worsening of the pain with deep breaths.  No cough.  She says that when she exerts herself too strenuously she does get short of breath but has not had a significant amount of shortness of breath.  Pain does not feel like it is within her chest.  Denies discharge from her nipple.  No fevers or chills.   History reviewed. No pertinent past medical history. History reviewed. No pertinent past surgical history. No family history on file. Social History  Substance Use Topics  . Smoking status: Never Smoker   . Smokeless tobacco: Never Used  . Alcohol Use: No   OB History    Gravida Para Term Preterm AB TAB SAB Ectopic Multiple Living   1              Review of Systems  Constitutional: Negative for fever, chills and fatigue.  HENT: Negative for congestion, postnasal drip and rhinorrhea.   Eyes: Negative for pain, redness and visual disturbance.  Respiratory: Positive for shortness of breath (with significant exertion). Negative for cough, chest tightness,  wheezing and stridor.   Cardiovascular: Positive for chest pain (left chest wall).  Gastrointestinal: Negative for nausea, vomiting, abdominal pain and diarrhea.  Genitourinary: Negative for dysuria, urgency, frequency, vaginal bleeding, vaginal discharge and pelvic pain.  Musculoskeletal: Negative for myalgias and back pain.  Skin: Negative for rash.  Neurological: Negative for dizziness, weakness, light-headedness and headaches.  Hematological: Does not bruise/bleed easily.      Allergies  Review of patient's allergies indicates no known allergies.  Home Medications   Prior to Admission medications   Medication Sig Start Date End Date Taking? Authorizing Provider  cephALEXin (KEFLEX) 250 MG capsule Take 1 capsule (250 mg total) by mouth 4 (four) times daily. 06/14/14   Margarita Grizzle, MD   BP 105/54 mmHg  Pulse 97  Temp(Src) 98.2 F (36.8 C) (Oral)  Resp 17  Ht  (1.626 m)  Wt 199 lb (90.266 kg)  BMI 34.14 kg/m2  SpO2 100%  LMP 05/24/2014 Physical Exam  Constitutional: She is oriented to person, place, and time. She appears well-developed and well-nourished. No distress.  HENT:  Head: Normocephalic and atraumatic.  Right Ear: External ear normal.  Left Ear: External ear normal.  Nose: Nose normal.  Mouth/Throat: Oropharynx is clear and moist. No oropharyngeal exudate.  Eyes: EOM are normal. Pupils are equal, round, and reactive to light.  Neck: Normal range of motion. Neck supple.  Cardiovascular: Regular rhythm, normal heart sounds and intact  distal pulses.  Tachycardia present.   No murmur heard. Pulmonary/Chest: Effort normal. No respiratory distress. She has no wheezes. She has no rales. She exhibits tenderness. Right breast exhibits no inverted nipple. Left breast exhibits tenderness. Left breast exhibits no inverted nipple and no nipple discharge.    Abdominal: Soft. She exhibits no distension. There is no tenderness.  Gravid uterus consistent with dates    Musculoskeletal: Normal range of motion. She exhibits no edema or tenderness.  Neurological: She is alert and oriented to person, place, and time.  Skin: Skin is warm and dry. No rash noted. She is not diaphoretic.  Vitals reviewed.   ED Course  Procedures (including critical care time) Labs Review Labs Reviewed  CBC WITH DIFFERENTIAL/PLATELET - Abnormal; Notable for the following:    Hemoglobin 9.4 (*)    HCT 30.7 (*)    MCV 74.2 (*)    MCH 22.7 (*)    All other components within normal limits  BASIC METABOLIC PANEL - Abnormal; Notable for the following:    Potassium 3.3 (*)    Glucose, Bld 133 (*)    BUN 5 (*)    Calcium 8.5 (*)    All other components within normal limits  D-DIMER, QUANTITATIVE (NOT AT [facility]) - Abnormal; Notable for the following:    D-Dimer, Quant 1.15 (*)    All other components within normal limits    Imaging Review US Venous Img Lower Bilateral  01/19/2015  CLINICAL DATA:  19 year old female with left-sided chest and breast pain and tachycardia for the past 2 weeks. EXAM: BILATERAL LOWER EXTREMITY VENOUS DOPPLER ULTRASOUND TECHNIQUE: Gray-scale sonography with graded compression, as well as color Doppler and duplex ultrasound were performed to evaluate the lower extremity deep venous systems from the level of the common femoral vein and including the common femoral, femoral, profunda femoral, popliteal and calf veins including the posterior tibial, peroneal and gastrocnemius veins when visible. The superficial great saphenous vein was also interrogated. Spectral Doppler was utilized to evaluate flow at rest and with distal augmentation maneuvers in the common femoral, femoral and popliteal veins. COMPARISON:  No priors. FINDINGS: RIGHT LOWER EXTREMITY Common Femoral Vein: No evidence of thrombus. Normal compressibility, respiratory phasicity and response to augmentation. Saphenofemoral Junction: No evidence of thrombus. Normal compressibility and flow on color  Doppler imaging. Profunda Femoral Vein: No evidence of thrombus. Normal compressibility and flow on color Doppler imaging. Femoral Vein: No evidence of thrombus. Normal compressibility, respiratory phasicity and response to augmentation. Popliteal Vein: No evidence of thrombus. Normal compressibility, respiratory phasicity and response to augmentation. Calf Veins: No evidence of thrombus. Normal compressibility and flow on color Doppler imaging. Superficial Great Saphenous Vein: No evidence of thrombus. Normal compressibility and flow on color Doppler imaging. Venous Reflux:  None. Other Findings:  None. LEFT LOWER EXTREMITY Common Femoral Vein: No evidence of thrombus. Normal compressibility, respiratory phasicity and response to augmentation. Saphenofemoral Junction: No evidence of thrombus. Normal compressibility and flow on color Doppler imaging. Profunda Femoral Vein: No evidence of thrombus. Normal compressibility and flow on color Doppler imaging. Femoral Vein: No evidence of thrombus. Normal compressibility, respiratory phasicity and response to augmentation. Popliteal Vein: No evidence of thrombus. Normal compressibility, respiratory phasicity and response to augmentation. Calf Veins: No evidence of thrombus. Normal compressibility and flow on color Doppler imaging. Superficial Great Saphenous Vein: No evidence of thrombus. Normal compressibility and flow on color Doppler imaging. Venous Reflux:  None. Other Findings:  None. IMPRESSION: No evidence of deep venous thrombosis in  either lower extremity. Electronically Signed   By: Trudie Reedaniel  Entrikin M.D.   On: 01/19/2015 01:07   Dg Chest Port 1 View  01/18/2015  CLINICAL DATA:  Pain in the left upper breast.  [redacted] weeks pregnant. EXAM: PORTABLE CHEST 1 VIEW COMPARISON:  None. FINDINGS: Lungs are clear. There is electronic device overlying the left upper abdomen. Heart size is normal. Trachea is midline. Negative for a pneumothorax. IMPRESSION: No acute chest  abnormality. Electronically Signed   By: Richarda OverlieAdam  Henn M.D.   On: 01/18/2015 20:57   I have personally reviewed and evaluated these images and lab results as part of my medical decision-making.   EKG Interpretation   Date/Time:  Monday January 18 2015 19:17:10 EDT Ventricular Rate:  128 PR Interval:  126 QRS Duration: 80 QT Interval:  316 QTC Calculation: 461 R Axis:   -17 Text Interpretation:  Sinus tachycardia Moderate voltage criteria for LVH,  may be normal variant Borderline ECG No previous ECGs available Confirmed  by Anibal Quinby ([id number]) on 01/18/2015 9:27:26 PM      MDM  Patietn was seen and evaluated in stable condition.  Patient well appearing but with sustained sinus tachycardia in the ED.  Pain reproducible and history does not sound consistent with PE and seems more like chest pain related to engorging breast tissue.  Patient was given IV fluids with mild improvement in heart rate.  Chest xray unremarkable.  Patient was anemic but no previous values to compare it to.  Discussed with OB on call the tachycardia and chest wall tenderness.  They recommended that a D dimer could be sent and that in the 3rd trimester this could be considered normal up to 1.7 and he also recommended that if D dimer was elevated dopplers of the lower extremities could be obtained and that if those were normal it would be very unlikely to be a PE.  Patient's d dimer 1.15 and so elevated from normal but with range given by OB.  Discussed option of CT to rule out PE with patient and boyfriend and patient declined at this time but dopplers of the lower extremities were obtained which were normal.  Discussed at length with patient and boyfriend the results and the clinical impression that was most consistent with breast tenderness from glandular growth in pregnancy.  They expressed understanding and agreement and did not want to proceed further.  They said they would follow up with their OB as soon as possible.   Patient discharged home in stable condition with strict return precautions. Final diagnoses:  Tachycardia    1. Breast pain  2. Tachycardia     Leta BaptistEmily Roe Tamaka Sawin, MD 01/19/15 [phone]

## 2015-01-19 NOTE — ED Notes (Signed)
Pt. Is in Radiology for US of lower extremity doppler.

## 2015-01-19 NOTE — Discharge Instructions (Signed)
You were seen today for your chest wall pain that seems to be localized over breast tissue that is growing and expanding with your pregnancy.  Use tylenol for your pain and apply warm compresses over the area of tenderness. See your OBGYN in follow up as soon as possible for reevaluation.  You were noted to have a fast heart rate and had a blood rest and US of your legs to help rule out blood clot.  These tests are not perfect and if you develop sudden shortness of breath, change in your chest pain, or worsening chest pain seek immediate medical attention either at this ER or the closest facility.  Chest Wall Pain Chest wall pain is pain in or around the bones and muscles of your chest. Sometimes, an injury causes this pain. Sometimes, the cause may not be known. This pain may take several weeks or longer to get better. HOME CARE INSTRUCTIONS  Pay attention to any changes in your symptoms. Take these actions to help with your pain:   Rest as told by your health care provider.   Avoid activities that cause pain. These include any activities that use your chest muscles or your abdominal and side muscles to lift heavy items.   If directed, apply ice to the painful area:  Put ice in a plastic bag.  Place a towel between your skin and the bag.  Leave the ice on for 20 minutes, 2-3 times per day.  Take over-the-counter and prescription medicines only as told by your health care provider.  Do not use tobacco products, including cigarettes, chewing tobacco, and e-cigarettes. If you need help quitting, ask your health care provider.  Keep all follow-up visits as told by your health care provider. This is important. SEEK MEDICAL CARE IF:  You have a fever.  Your chest pain becomes worse.  You have new symptoms. SEEK IMMEDIATE MEDICAL CARE IF:  You have nausea or vomiting.  You feel sweaty or light-headed.  You have a cough with phlegm (sputum) or you cough up blood.  You develop  shortness of breath.   This information is not intended to replace advice given to you by your health care provider. Make sure you discuss any questions you have with your health care provider.   Document Released: 03/13/2005 Document Revised: 12/02/2014 Document Reviewed: 06/08/2014 Elsevier Interactive Patient Education [employer]2016 [employer]vier Inc.

## 2015-01-19 NOTE — ED Notes (Signed)
Family at bedside. 

## 2015-11-07 DIAGNOSIS — Z3046 Encounter for surveillance of implantable subdermal contraceptive: Secondary | ICD-10-CM

## 2015-11-07 HISTORY — DX: Encounter for surveillance of implantable subdermal contraceptive: Z30.46

## 2015-11-23 ENCOUNTER — Encounter (HOSPITAL_COMMUNITY): Payer: Self-pay

## 2016-02-14 ENCOUNTER — Emergency Department (HOSPITAL_BASED_OUTPATIENT_CLINIC_OR_DEPARTMENT_OTHER)
Admission: EM | Admit: 2016-02-14 | Discharge: 2016-02-14 | Disposition: A | Discharge status: Home / Self Care | Payer: Medicaid Other | Attending: Emergency Medicine | Admitting: Emergency Medicine

## 2016-02-14 ENCOUNTER — Encounter (HOSPITAL_BASED_OUTPATIENT_CLINIC_OR_DEPARTMENT_OTHER): Payer: Self-pay | Admitting: *Deleted

## 2016-02-14 DIAGNOSIS — L02412 Cutaneous abscess of left axilla: Secondary | ICD-10-CM | POA: Insufficient documentation

## 2016-02-14 MED ORDER — CEPHALEXIN 250 MG PO CAPS
500.0000 mg | ORAL_CAPSULE | Freq: Once | ORAL | Status: AC
  Administered 2016-02-14: 500 mg via ORAL
  Filled 2016-02-14: qty 2

## 2016-02-14 MED ORDER — SULFAMETHOXAZOLE-TRIMETHOPRIM 800-160 MG PO TABS
1.0000 | ORAL_TABLET | Freq: Once | ORAL | Status: AC
  Administered 2016-02-14: 1 via ORAL
  Filled 2016-02-14: qty 1

## 2016-02-14 MED ORDER — SULFAMETHOXAZOLE-TRIMETHOPRIM 800-160 MG PO TABS: 1.0000 | ORAL_TABLET | Freq: Two times a day (BID) | ORAL | 0 refills | Status: AC

## 2016-02-14 MED ORDER — CEPHALEXIN 500 MG PO CAPS: 500.0000 mg | ORAL_CAPSULE | Freq: Two times a day (BID) | ORAL | 0 refills | Status: DC

## 2016-02-14 NOTE — ED Provider Notes (Signed)
[facility]   CSN: [id number] Arrival date & time: 02/14/16  1806  By signing my name below, I, Vista Minkobert Ross, attest that this documentation has been prepared under the direction and in the presence of KeyCorpSerena Raynah Gomes PA-C. Electronically Signed: Vista Minkobert Ross, ED Scribe. 02/14/16. 6:49 PM.  History   Chief Complaint Chief Complaint  Patient presents with  . Abscess    HPI HPI Comments: Claudia Webb is a 20 y.o. female, with no pertinent PMHx, who presents to the Emergency Department complaining of waxing and waning, possible abscess to left axillary for the past week. Pt states that the area has increased and decreased in size over the past two weeks. She has had no drainage from the area. No Hx of similar symptoms. No fever or other associated symptoms. She has not tried anything for the pain  The history is provided by the patient. No language interpreter was used.    History reviewed. No pertinent past medical history.  There are no active problems to display for this patient.   History reviewed. No pertinent surgical history.  OB History    Gravida Para Term Preterm AB Living   1             SAB TAB Ectopic Multiple Live Births                   Home Medications    Prior to Admission medications   Not on File    Family History History reviewed. No pertinent family history.  Social History Social History  Substance Use Topics  . Smoking status: Never Smoker  . Smokeless tobacco: Never Used  . Alcohol use No     Allergies   Patient has no known allergies.   Review of Systems Review of Systems 10 Systems reviewed and all are negative for acute change except as noted in the HPI.  Physical Exam Updated Vital Signs BP 111/96   Pulse 97   Temp 98.4 F (36.9 C)   Resp 18   Ht 5\' 4"  (1.626 m)   Wt 190 lb (86.2 kg)   LMP 01/26/2016   SpO2 100%   BMI 32.61 kg/m   Physical Exam  Constitutional: She is oriented to person,  place, and time. She appears well-developed and well-nourished. No distress.  HENT:  Head: Normocephalic and atraumatic.  Neck: Normal range of motion.  Pulmonary/Chest: Effort normal.  Neurological: She is alert and oriented to person, place, and time.  Skin: Skin is warm and dry. She is not diaphoretic.  Left axilla with palpable 1.5 cm firm mass with no overlying skin erythema. Tender. Not fluctuant.   Psychiatric: She has a normal mood and affect. Judgment normal.  Nursing note and vitals reviewed.    ED Treatments / Results  DIAGNOSTIC STUDIES: Oxygen Saturation is 100% on RA, normal by my interpretation.  COORDINATION OF CARE: 6:49 PM-Will dx with abx. Discussed treatment plan with pt at bedside and pt agreed to plan.   Labs (all labs ordered are listed, but only abnormal results are displayed) Labs Reviewed - No data to display  EKG  EKG Interpretation None       Radiology No results found.  Procedures Procedures (including critical care time)  Medications Ordered in ED Medications - No data to display   Initial Impression / Assessment and Plan / ED Course  I have reviewed the triage vital signs and the nursing notes.  Pertinent labs & imaging results that  were available during my care of the patient were reviewed by me and considered in my medical decision making (see chart for details).  Clinical Course     Patient presenting with likely developing axillary abscess. The area is not fluctuant with no purulent head visible. No overlying cellulitis. Discussed option of I&D with pt though suspect there would not be much pus expressible. Pt declines I&D at this time which I think is reasonable. We will trial a course of keflex and bactrim. I discussed with pt that oftentimes despite antibiotic therapy the abscess might continue to worsen. She verbalized her agreement and understanding. ER return precautions given.  Final Clinical Impressions(s) / ED Diagnoses    Final diagnoses:  Abscess of left axilla    New Prescriptions New Prescriptions   CEPHALEXIN (KEFLEX) 500 MG CAPSULE    Take 1 capsule (500 mg total) by mouth 2 (two) times daily.   SULFAMETHOXAZOLE-TRIMETHOPRIM (BACTRIM DS,SEPTRA DS) 800-160 MG TABLET    Take 1 tablet by mouth 2 (two) times daily.   I personally performed the services described in this documentation, which was scribed in my presence. The recorded information has been reviewed and is accurate.    Carlene CoriaSerena Y Jazmin Vensel, PA-C 02/14/16 1908    Arby BarretteMarcy Pfeiffer, MD 02/17/16 1328

## 2016-02-14 NOTE — ED Triage Notes (Signed)
Pt c/o abscess to left axillary x 2 weeks

## 2016-02-14 NOTE — Discharge Instructions (Signed)
Take antibiotics as prescribed. Take ibuprofen or tylenol as needed for pain. Return to the ER for new or worsening symptoms.

## 2016-02-25 ENCOUNTER — Encounter (HOSPITAL_BASED_OUTPATIENT_CLINIC_OR_DEPARTMENT_OTHER): Payer: Self-pay | Admitting: Emergency Medicine

## 2016-02-25 ENCOUNTER — Emergency Department (HOSPITAL_BASED_OUTPATIENT_CLINIC_OR_DEPARTMENT_OTHER)
Admission: EM | Admit: 2016-02-25 | Discharge: 2016-02-26 | Disposition: A | Discharge status: Home / Self Care | Payer: Medicaid Other | Attending: Emergency Medicine | Admitting: Emergency Medicine

## 2016-02-25 DIAGNOSIS — B308 Other viral conjunctivitis: Secondary | ICD-10-CM

## 2016-02-25 DIAGNOSIS — B309 Viral conjunctivitis, unspecified: Secondary | ICD-10-CM | POA: Diagnosis not present

## 2016-02-25 DIAGNOSIS — H579 Unspecified disorder of eye and adnexa: Secondary | ICD-10-CM | POA: Diagnosis present

## 2016-02-25 MED ORDER — FLUORESCEIN SODIUM 1 MG OP STRP
ORAL_STRIP | OPHTHALMIC | Status: AC
  Filled 2016-02-25: qty 1

## 2016-02-25 MED ORDER — TOBRAMYCIN 0.3 % OP OINT: TOPICAL_OINTMENT | OPHTHALMIC | Status: DC

## 2016-02-25 MED ORDER — TOBRAMYCIN 0.3 % OP SOLN
OPHTHALMIC | Status: AC
  Filled 2016-02-25: qty 5

## 2016-02-25 MED ORDER — TETRACAINE HCL 0.5 % OP SOLN: 2.0000 [drp] | Freq: Once | OPHTHALMIC | Status: DC

## 2016-02-25 MED ORDER — TOBRAMYCIN 0.3 % OP SOLN
1.0000 [drp] | Freq: Once | OPHTHALMIC | Status: AC
  Administered 2016-02-25: 1 [drp] via OPHTHALMIC

## 2016-02-25 MED ORDER — TOBRAMYCIN 0.3 % OP OINT
TOPICAL_OINTMENT | OPHTHALMIC | Status: AC
Start: 2016-02-25 — End: 2016-02-26
  Filled 2016-02-25: qty 3.5

## 2016-02-25 MED ORDER — TETRACAINE HCL 0.5 % OP SOLN
OPHTHALMIC | Status: AC
  Filled 2016-02-25: qty 4

## 2016-02-25 MED ORDER — FLUORESCEIN SODIUM 1 MG OP STRP: 1.0000 | ORAL_STRIP | Freq: Once | OPHTHALMIC | Status: DC

## 2016-02-25 NOTE — Discharge Instructions (Signed)
Place 1 drop of the antibiotic drop into the right eye every 4 hours while awake. Call Dr.Narendra to arrange to be seen in the office if your eye is not improved by next week

## 2016-02-25 NOTE — ED Triage Notes (Signed)
Patient reports that she is having drainage to her right eye x 2 days

## 2016-02-25 NOTE — ED Provider Notes (Signed)
[facility]   CSN: [id number] Arrival date & time: 02/25/16  2011     History   Chief Complaint Chief Complaint  Patient presents with  . Eye Drainage    HPI Claudia GarterKala Webb is a 20 y.o. female.  HPI complains of right eyelashes being stuck together upon awakening onset yesterday. Other associated symptoms include eye redness and mild discomfort in right eye. She denies visual changes. Treated with Visine, with transient relief. No other associated symptoms. Nothing makes symptoms better or worse.  History reviewed. No pertinent past medical history.  There are no active problems to display for this patient.   History reviewed. No pertinent surgical history.  OB History    Gravida Para Term Preterm AB Living   1             SAB TAB Ectopic Multiple Live Births                   Home Medications    Prior to Admission medications   Medication Sig Start Date End Date Taking? Authorizing Provider  cephALEXin (KEFLEX) 500 MG capsule Take 1 capsule (500 mg total) by mouth 2 (two) times daily. 02/14/16   Carlene CoriaSerena Y Dajahnae Vondra, PA-C    Family History History reviewed. No pertinent family history.  Social History Social History  Substance Use Topics  . Smoking status: Never Smoker  . Smokeless tobacco: Never Used  . Alcohol use No     Allergies   Patient has no known allergies.   Review of Systems Review of Systems  Constitutional: Negative.   Eyes: Positive for discharge and redness.     Physical Exam Updated Vital Signs BP 126/74 (BP Location: Left Arm)   Pulse 88   Temp 98 F (36.7 C) (Oral)   Resp 16   Ht 5\' 4"  (1.626 m)   Wt 190 lb (86.2 kg)   LMP 02/24/2016   SpO2 98%   BMI 32.61 kg/m   Physical Exam  Constitutional: She is oriented to person, place, and time. She appears well-developed and well-nourished. No distress.  HENT:  Head: Normocephalic and atraumatic.  Eyes: EOM are normal. Pupils are equal, round, and reactive  to light.  Conjunctival erythema right eye. Left eye normal. No pain on extraocular movement  Neck: Normal range of motion.  Cardiovascular: Normal rate.   Pulmonary/Chest: Effort normal.  Abdominal: She exhibits no distension.  Musculoskeletal: Normal range of motion.  Neurological: She is oriented to person, place, and time.  Skin: Skin is dry. No rash noted.  Psychiatric: She has a normal mood and affect.     ED Treatments / Results  Labs (all labs ordered are listed, but only abnormal results are displayed) Labs Reviewed - No data to display  EKG  EKG Interpretation None       Radiology No results found.  Procedures Procedures (including critical care time)  Medications Ordered in ED Medications  fluorescein ophthalmic strip 1 strip (not administered)  tetracaine (PONTOCAINE) 0.5 % ophthalmic solution 2 drop (not administered)  tobramycin (TOBREX) 0.3 % ophthalmic ointment (not administered)     Initial Impression / Assessment and Plan / ED Course  I have reviewed the triage vital signs and the nursing notes.  Pertinent labs & imaging results that were available during my care of the patient were reviewed by me and considered in my medical decision making (see chart for details).  Clinical Course     Plan TobraDex ophthalmic drops  one drop in right eye every 4 hours while awake. Referral Dr.Narendra if not improved by next week  Final Clinical Impressions(s) / ED Diagnoses  Diagnosis conjunctivitis of right eye Final diagnoses:  None    New Prescriptions New Prescriptions   No medications on file     Doug SouSam Jaymeson Mengel, MD 02/25/16 2346

## 2016-02-26 NOTE — ED Notes (Signed)
Pt verbalizes understanding of d/c instructions and denies any further needs at this time. 

## 2016-04-08 IMAGING — DX DG CHEST 1V PORT
1 series · 1 of 1 positions shown · non-contrast
Comparison: None.

CLINICAL DATA: Pain in the left upper breast.  35 weeks pregnant.

EXAM:
PORTABLE CHEST 1 VIEW

[chest ap]
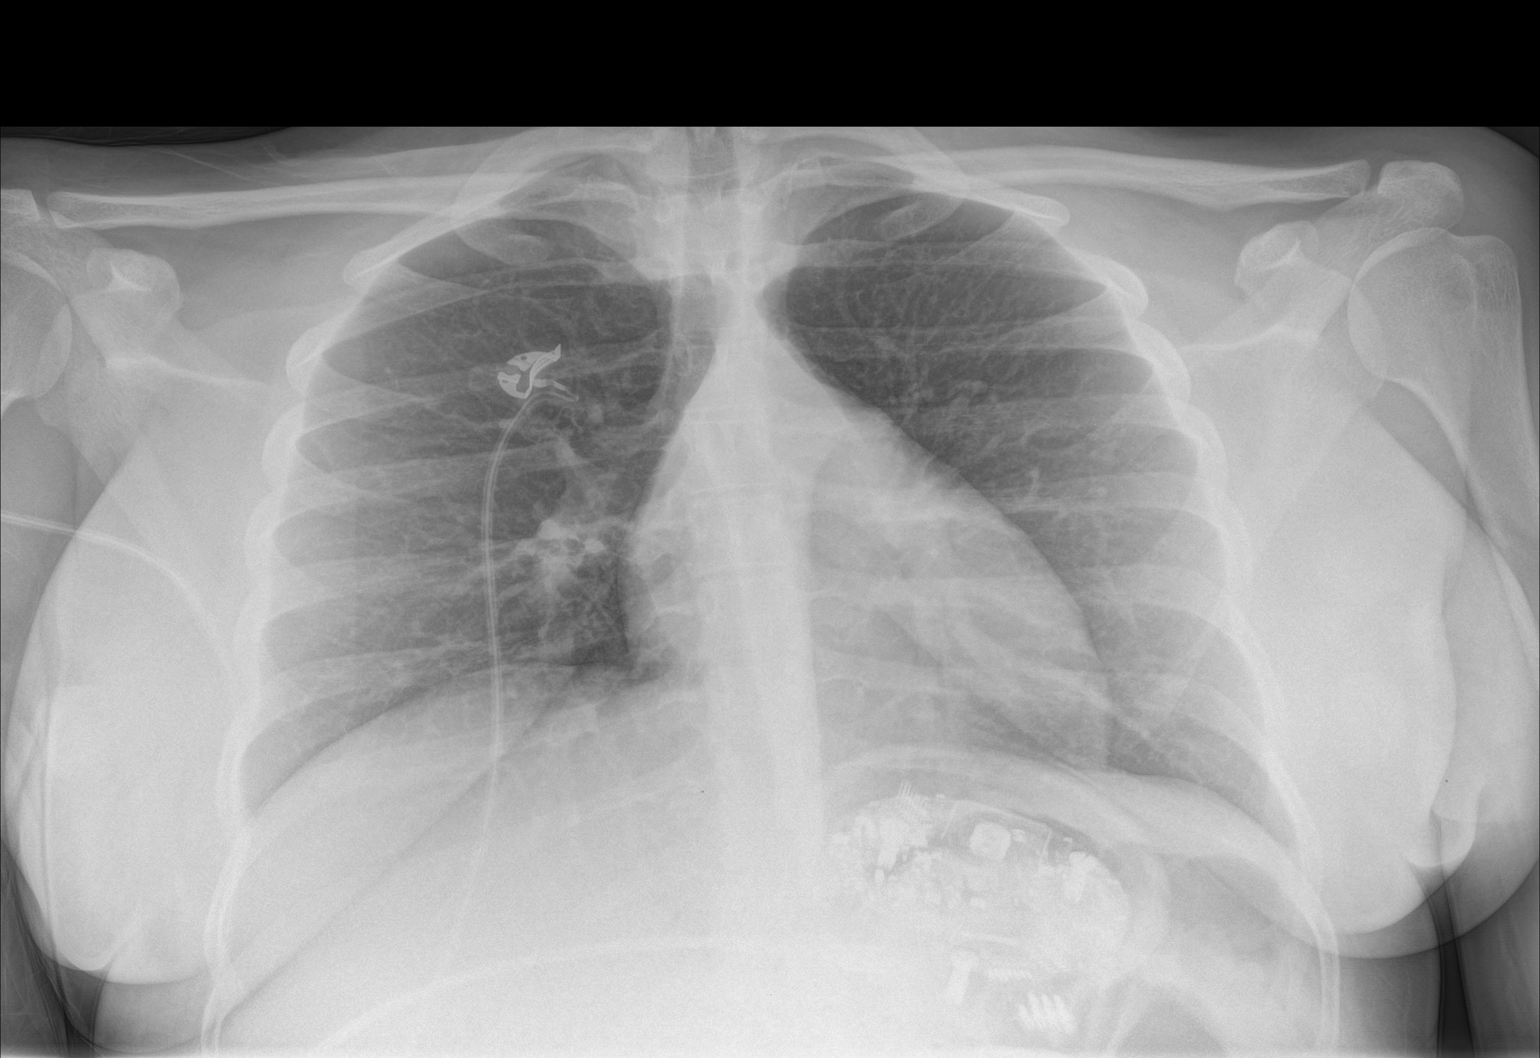

[1 of 1 positions shown; findings below may reference images not displayed]

FINDINGS: Lungs are clear. There is electronic device overlying the left upper
abdomen. Heart size is normal. Trachea is midline. Negative for a
pneumothorax.
IMPRESSION: No acute chest abnormality.

## 2017-03-30 ENCOUNTER — Other Ambulatory Visit: Payer: Self-pay

## 2017-03-30 ENCOUNTER — Emergency Department (HOSPITAL_BASED_OUTPATIENT_CLINIC_OR_DEPARTMENT_OTHER)
Admission: EM | Admit: 2017-03-30 | Discharge: 2017-03-30 | Disposition: A | Discharge status: Home / Self Care | Payer: Medicaid Other | Attending: Emergency Medicine | Admitting: Emergency Medicine

## 2017-03-30 ENCOUNTER — Encounter (HOSPITAL_BASED_OUTPATIENT_CLINIC_OR_DEPARTMENT_OTHER): Payer: Self-pay | Admitting: Emergency Medicine

## 2017-03-30 DIAGNOSIS — L02818 Cutaneous abscess of other sites: Secondary | ICD-10-CM | POA: Insufficient documentation

## 2017-03-30 DIAGNOSIS — Z79899 Other long term (current) drug therapy: Secondary | ICD-10-CM | POA: Diagnosis not present

## 2017-03-30 DIAGNOSIS — L0291 Cutaneous abscess, unspecified: Secondary | ICD-10-CM

## 2017-03-30 DIAGNOSIS — R600 Localized edema: Secondary | ICD-10-CM | POA: Diagnosis present

## 2017-03-30 MED ORDER — CEPHALEXIN 500 MG PO CAPS: 500.0000 mg | ORAL_CAPSULE | Freq: Four times a day (QID) | ORAL | 0 refills | Status: DC

## 2017-03-30 MED ORDER — HYDROCODONE-ACETAMINOPHEN 5-325 MG PO TABS: 1.0000 | ORAL_TABLET | Freq: Four times a day (QID) | ORAL | 0 refills | Status: DC | PRN

## 2017-03-30 MED ORDER — LIDOCAINE HCL 1 % IJ SOLN
INTRAMUSCULAR | Status: AC
  Filled 2017-03-30: qty 20

## 2017-03-30 MED ORDER — IBUPROFEN 800 MG PO TABS: 800.0000 mg | ORAL_TABLET | Freq: Three times a day (TID) | ORAL | 0 refills | Status: DC

## 2017-03-30 MED ORDER — SULFAMETHOXAZOLE-TRIMETHOPRIM 800-160 MG PO TABS: 1.0000 | ORAL_TABLET | Freq: Two times a day (BID) | ORAL | 0 refills | Status: AC

## 2017-03-30 NOTE — ED Notes (Signed)
Nurse First Rounding: Updated on wait times, delay explained. Pt comfortable, looking at her phone with headphones in her ears. No complaints.

## 2017-03-30 NOTE — ED Notes (Signed)
ED Provider at bedside. 

## 2017-03-30 NOTE — ED Triage Notes (Signed)
Patient reports abscess to groin x 2 days. Denies fevers.

## 2017-03-30 NOTE — ED Notes (Signed)
Pt verbalizes understanding of d/c instructions and denies any further need at this time. 

## 2017-03-30 NOTE — Discharge Instructions (Signed)
Medications: Keflex, Bactrim, Norco, ibuprofen  Treatment: Take Keflex and Bactrim as prescribed until completed.  Wash the wound with warm soapy water starting tomorrow morning.  Keep clean and covered.  Replace dressing daily.  Take 1-2 Norco every 4-6 hours as needed for severe pain.  Take ibuprofen every 8 hours as needed for mild to moderate pain.  Do not drink alcohol, drive, operate machinery or participate in any other potentially dangerous activities while taking opiate pain medication as it may make you sleepy. Do not take this medication with any other sedating medications, either prescription or over-the-counter. If you were prescribed Percocet or Vicodin, do not take these with acetaminophen (Tylenol) as it is already contained within these medications and overdose of Tylenol is dangerous.   This medication is an opiate (or narcotic) pain medication and can be habit forming.  Use it as little as possible to achieve adequate pain control.  Do not use or use it with extreme caution if you have a history of opiate abuse or dependence. This medication is intended for your use only - do not give any to anyone else and keep it in a secure place where nobody else, especially children, have access to it. It will also cause or worsen constipation, so you may want to consider taking an over-the-counter stool softener while you are taking this medication.  Follow-up: Please return to the emergency department in 2 days for wound check.  Please return  sooner if you develop any increasing pain, redness, swelling, red streaking from the area.

## 2017-03-31 NOTE — ED Provider Notes (Signed)
[facility] EMERGENCY DEPARTMENT Provider Note   CSN: [id number] Arrival date & time: 03/30/17  1551     History   Chief Complaint Chief Complaint  Patient presents with  . Abscess    HPI Claudia Webb is a 22 y.o. female with history of abscess in the past who presents with abscess to the left inguinal region.  Patient reports she developed a painful bump that has increased in size over the past 2 days.  It has not drained.  She has not tried any interventions at home.  Patient reports never having to have incision and drainage or abscesses in the past, that she has had in the same region, as well as in her axilla.  She denies any fevers.  HPI  History reviewed. No pertinent past medical history.  There are no active problems to display for this patient.   History reviewed. No pertinent surgical history.  OB History    Gravida Para Term Preterm AB Living   1             SAB TAB Ectopic Multiple Live Births                   Home Medications    Prior to Admission medications   Medication Sig Start Date End Date Taking? Authorizing Provider  cephALEXin (KEFLEX) 500 MG capsule Take 1 capsule (500 mg total) by mouth 4 (four) times daily. 03/30/17   Venise Ellingwood, Waylan Boga, PA-C  HYDROcodone-acetaminophen (NORCO/VICODIN) 5-325 MG tablet Take 1-2 tablets by mouth every 6 (six) hours as needed. 03/30/17   Doretta Remmert, Waylan Boga, PA-C  ibuprofen (ADVIL,MOTRIN) 800 MG tablet Take 1 tablet (800 mg total) by mouth 3 (three) times daily. 03/30/17   Taura Lamarre, Waylan Boga, PA-C  sulfamethoxazole-trimethoprim (BACTRIM DS,SEPTRA DS) 800-160 MG tablet Take 1 tablet by mouth 2 (two) times daily for 7 days. 03/30/17 04/06/17  Emi Holes, PA-C    Family History History reviewed. No pertinent family history.  Social History Social History   Tobacco Use  . Smoking status: Never Smoker  . Smokeless tobacco: Never Used  Substance Use Topics  . Alcohol use: No  . Drug use: No     Allergies     Patient has no known allergies.   Review of Systems Review of Systems  Constitutional: Negative for fever.  Skin: Positive for wound.     Physical Exam Updated Vital Signs BP 102/63   Pulse 74   Temp 98.8 F (37.1 C) (Oral)   Resp 18   Ht 5\' 4"  (1.626 m)   Wt 70.3 kg (155 lb)   LMP 03/25/2017   SpO2 100%   BMI 26.61 kg/m   Physical Exam  Constitutional: She appears well-developed and well-nourished. No distress.  HENT:  Head: Normocephalic and atraumatic.  Mouth/Throat: Oropharynx is clear and moist. No oropharyngeal exudate.  Eyes: Conjunctivae are normal. Pupils are equal, round, and reactive to light. Right eye exhibits no discharge. Left eye exhibits no discharge. No scleral icterus.  Neck: Normal range of motion.  Cardiovascular: Normal rate, regular rhythm, normal heart sounds and intact distal pulses. Exam reveals no gallop and no friction rub.  No murmur heard. Pulmonary/Chest: Effort normal and breath sounds normal. No stridor. No respiratory distress. She has no wheezes. She has no rales.  Abdominal: Soft. Bowel sounds are normal.  Genitourinary:  Genitourinary Comments: 5 cm area of induration just superior to L labia with 2 cm area of fluctuance; significantly tenderness  Musculoskeletal: She exhibits no edema.  Neurological: She is alert. Coordination normal.  Skin: Skin is warm and dry. No rash noted. She is not diaphoretic. No pallor.  Psychiatric: She has a normal mood and affect.  Nursing note and vitals reviewed.    ED Treatments / Results  Labs (all labs ordered are listed, but only abnormal results are displayed) Labs Reviewed - No data to display  EKG  EKG Interpretation None       Radiology No results found.  Procedures .Marland Kitchen.Incision and Drainage Date/Time: 03/31/2017 11:59 AM Performed by: Emi HolesLaw, Aloysious Vangieson M, PA-C Authorized by: Emi HolesLaw, Bradan Congrove M, PA-C   Consent:    Consent obtained:  Verbal   Consent given by:  Patient   Risks  discussed:  Bleeding, incomplete drainage and pain   Alternatives discussed:  No treatment and alternative treatment Location:    Type:  Abscess   Size:  5 cm   Location:  Anogenital   Anogenital location: superior to L labia. Pre-procedure details:    Skin preparation:  Chloraprep Anesthesia (see MAR for exact dosages):    Anesthesia method:  Local infiltration   Local anesthetic:  Lidocaine 1% w/o epi Procedure type:    Complexity:  Simple Procedure details:    Needle aspiration: no     Incision types:  Single straight   Scalpel blade:  11   Wound management:  Irrigated with saline and probed and deloculated   Drainage:  Purulent and bloody   Drainage amount:  Moderate   Wound treatment:  Wound left open   Packing materials:  None Post-procedure details:    Patient tolerance of procedure:  Tolerated well, no immediate complications   (including critical care time)  Medications Ordered in ED Medications - No data to display   Initial Impression / Assessment and Plan / ED Course  I have reviewed the triage vital signs and the nursing notes.  Pertinent labs & imaging results that were available during my care of the patient were reviewed by me and considered in my medical decision making (see chart for details).     Patient with skin abscess. Incision and drainage performed in the ED today.  Abscess was not large enough to warrant packing or drain placement. Wound recheck in 2 days. Supportive care and return precautions discussed.  Pt sent home with Bactrim, Keflex. Patient denies any possibility of pregnancy. Also discharged home with ibuprofen and short course of Norco for pain. I reviewed the  narcotic database and found no discrepancies. Patient understands and agrees with plan.  Patient vitals stable throughout ED course and discharged in satisfactory condition.     Final Clinical Impressions(s) / ED Diagnoses   Final diagnoses:  Abscess    ED Discharge Orders          Ordered    cephALEXin (KEFLEX) 500 MG capsule  4 times daily     03/30/17 2014    sulfamethoxazole-trimethoprim (BACTRIM DS,SEPTRA DS) 800-160 MG tablet  2 times daily     03/30/17 2014    HYDROcodone-acetaminophen (NORCO/VICODIN) 5-325 MG tablet  Every 6 hours PRN     03/30/17 2016    ibuprofen (ADVIL,MOTRIN) 800 MG tablet  3 times daily     03/30/17 2016       Emi HolesLaw, Estill Llerena M, PA-C 03/31/17 1204    Nira Connardama, Pedro Eduardo, MD 04/01/17 [phone]

## 2017-05-29 DIAGNOSIS — E010 Iodine-deficiency related diffuse (endemic) goiter: Secondary | ICD-10-CM | POA: Insufficient documentation

## 2017-05-29 HISTORY — DX: Iodine-deficiency related diffuse (endemic) goiter: E01.0

## 2017-06-05 ENCOUNTER — Encounter (HOSPITAL_BASED_OUTPATIENT_CLINIC_OR_DEPARTMENT_OTHER): Payer: Self-pay | Admitting: *Deleted

## 2017-06-05 ENCOUNTER — Other Ambulatory Visit: Payer: Self-pay

## 2017-06-05 DIAGNOSIS — Z79899 Other long term (current) drug therapy: Secondary | ICD-10-CM | POA: Insufficient documentation

## 2017-06-05 DIAGNOSIS — G44209 Tension-type headache, unspecified, not intractable: Secondary | ICD-10-CM | POA: Insufficient documentation

## 2017-06-05 DIAGNOSIS — R51 Headache: Secondary | ICD-10-CM | POA: Diagnosis present

## 2017-06-05 NOTE — ED Triage Notes (Signed)
Headache since yesterday. She has been taking Advil without relief.

## 2017-06-06 ENCOUNTER — Emergency Department (HOSPITAL_BASED_OUTPATIENT_CLINIC_OR_DEPARTMENT_OTHER)
Admission: EM | Admit: 2017-06-06 | Discharge: 2017-06-06 | Disposition: A | Discharge status: Home / Self Care | Payer: Medicaid Other | Attending: Emergency Medicine | Admitting: Emergency Medicine

## 2017-06-06 DIAGNOSIS — G44209 Tension-type headache, unspecified, not intractable: Secondary | ICD-10-CM

## 2017-06-06 MED ORDER — DIPHENHYDRAMINE HCL 50 MG/ML IJ SOLN
25.0000 mg | Freq: Once | INTRAMUSCULAR | Status: AC
  Administered 2017-06-06: 25 mg via INTRAVENOUS
  Filled 2017-06-06: qty 1

## 2017-06-06 MED ORDER — KETOROLAC TROMETHAMINE 30 MG/ML IJ SOLN
30.0000 mg | Freq: Once | INTRAMUSCULAR | Status: AC
  Administered 2017-06-06: 30 mg via INTRAMUSCULAR
  Filled 2017-06-06: qty 1

## 2017-06-06 MED ORDER — PROCHLORPERAZINE EDISYLATE 5 MG/ML IJ SOLN
10.0000 mg | Freq: Once | INTRAMUSCULAR | Status: AC
  Administered 2017-06-06: 10 mg via INTRAVENOUS
  Filled 2017-06-06: qty 2

## 2017-06-06 NOTE — ED Provider Notes (Signed)
[facility] EMERGENCY DEPARTMENT Provider Note   CSN: [id number] Arrival date & time: 06/05/17  2250     History   Chief Complaint Chief Complaint  Patient presents with  . Headache    HPI Claudia Webb is a 22 y.o. female.  HPI  This is a 22 year old female who presents with headache.  Patient reports 2-3-day history of right-sided temporal headache.  She reports that it is dull and achy in nature.  She rates her pain 8 out of 10.  With minimal relief.  She denies any nausea, vomiting, neck stiffness, fevers.  She does report some photophobia.  She denies any weakness, numbness, tingling, vision changes.  Patient reports history of headaches associated with birth control.  Since having her Implanon removed, she has not had any recurrent headaches.  History reviewed. No pertinent past medical history.  There are no active problems to display for this patient.   History reviewed. No pertinent surgical history.  OB History    Gravida Para Term Preterm AB Living   1             SAB TAB Ectopic Multiple Live Births                   Home Medications    Prior to Admission medications   Medication Sig Start Date End Date Taking? Authorizing Provider  ibuprofen (ADVIL,MOTRIN) 800 MG tablet Take 1 tablet (800 mg total) by mouth 3 (three) times daily. 03/30/17  Yes Law, Waylan Boga, PA-C  cephALEXin (KEFLEX) 500 MG capsule Take 1 capsule (500 mg total) by mouth 4 (four) times daily. 03/30/17   Law, Waylan Boga, PA-C  HYDROcodone-acetaminophen (NORCO/VICODIN) 5-325 MG tablet Take 1-2 tablets by mouth every 6 (six) hours as needed. 03/30/17   Emi Holes, PA-C    Family History No family history on file.  Social History Social History   Tobacco Use  . Smoking status: Never Smoker  . Smokeless tobacco: Never Used  Substance Use Topics  . Alcohol use: No  . Drug use: No     Allergies   Patient has no known allergies.   Review of Systems Review of Systems    Constitutional: Negative for fever.  Respiratory: Negative for shortness of breath.   Cardiovascular: Negative for chest pain.  Gastrointestinal: Negative for abdominal pain, diarrhea, nausea and vomiting.  Musculoskeletal: Negative for neck pain.  Neurological: Positive for headaches. Negative for weakness and numbness.  All other systems reviewed and are negative.    Physical Exam Updated Vital Signs BP 126/64 (BP Location: Right Arm)   Pulse 66   Temp 98.6 F (37 C) (Oral)   Resp 18   Ht 5\' 4"  (1.626 m)   Wt 70.3 kg (155 lb)   SpO2 99%   BMI 26.61 kg/m   Physical Exam  Constitutional: She is oriented to person, place, and time. She appears well-developed and well-nourished.  HENT:  Head: Normocephalic and atraumatic.  Eyes: Pupils are equal, round, and reactive to light.  Neck: Neck supple.  Cardiovascular: Normal rate, regular rhythm and normal heart sounds.  Pulmonary/Chest: Effort normal. No respiratory distress. She has no wheezes.  Abdominal: Soft. Bowel sounds are normal.  Neurological: She is alert and oriented to person, place, and time.  Cranial nerves II through XII intact, 5 out of 5 strength in all 4 extremities, no dysmetria to finger-nose-finger  Skin: Skin is warm and dry.  Psychiatric: She has a normal mood and affect.  Nursing note and vitals reviewed.    ED Treatments / Results  Labs (all labs ordered are listed, but only abnormal results are displayed) Labs Reviewed - No data to display  EKG  EKG Interpretation None       Radiology No results found.  Procedures Procedures (including critical care time)  Medications Ordered in ED Medications  ketorolac (TORADOL) 30 MG/ML injection 30 mg (30 mg Intramuscular Given 06/06/17 0231)  prochlorperazine (COMPAZINE) injection 10 mg (10 mg Intravenous Given 06/06/17 0355)  diphenhydrAMINE (BENADRYL) injection 25 mg (25 mg Intravenous Given 06/06/17 0354)     Initial Impression / Assessment  and Plan / ED Course  I have reviewed the triage vital signs and the nursing notes.  Pertinent labs & imaging results that were available during my care of the patient were reviewed by me and considered in my medical decision making (see chart for details).     Patient presents with headache.  No red flags on exam.  She is nontoxic appearing and vital signs are reassuring.  Her neurologic exam is normal.  Patient was initially given Toradol.  However, she reported persistent pain.  She was subsequently given Compazine and Benadryl with 100% improvement of her pain.  Doubt meningitis or subarachnoid hemorrhage.  Given location of pain, likely tension headache.  After history, exam, and medical workup I feel the patient has been appropriately medically screened and is safe for discharge home. Pertinent diagnoses were discussed with the patient. Patient was given return precautions.   Final Clinical Impressions(s) / ED Diagnoses   Final diagnoses:  Acute non intractable tension-type headache    ED Discharge Orders    None       Shon BatonHorton, Delaine Hernandez F, MD 06/06/17 [phone]

## 2017-06-06 NOTE — ED Notes (Addendum)
Pt reports her sister died from an brain aneurism when she was 22yo and worried because her HA has lasted several days

## 2017-06-06 NOTE — ED Notes (Signed)
Pt reports taking ibuprofen and tylenol with minor relief

## 2017-06-19 DIAGNOSIS — H43393 Other vitreous opacities, bilateral: Secondary | ICD-10-CM

## 2017-06-19 DIAGNOSIS — G43009 Migraine without aura, not intractable, without status migrainosus: Secondary | ICD-10-CM

## 2017-06-19 HISTORY — DX: Migraine without aura, not intractable, without status migrainosus: G43.009

## 2017-06-19 HISTORY — DX: Other vitreous opacities, bilateral: H43.393

## 2017-07-19 DIAGNOSIS — F410 Panic disorder [episodic paroxysmal anxiety] without agoraphobia: Secondary | ICD-10-CM

## 2017-07-19 HISTORY — DX: Panic disorder (episodic paroxysmal anxiety): F41.0

## 2017-08-05 DIAGNOSIS — R002 Palpitations: Secondary | ICD-10-CM

## 2017-08-05 HISTORY — DX: Palpitations: R00.2

## 2019-11-25 DIAGNOSIS — B009 Herpesviral infection, unspecified: Secondary | ICD-10-CM

## 2019-11-25 HISTORY — DX: Herpesviral infection, unspecified: B00.9

## 2022-01-24 ENCOUNTER — Encounter (HOSPITAL_BASED_OUTPATIENT_CLINIC_OR_DEPARTMENT_OTHER): Payer: Self-pay | Admitting: Emergency Medicine

## 2022-01-24 ENCOUNTER — Other Ambulatory Visit: Payer: Self-pay

## 2022-01-24 ENCOUNTER — Emergency Department (HOSPITAL_BASED_OUTPATIENT_CLINIC_OR_DEPARTMENT_OTHER)
Admission: EM | Admit: 2022-01-24 | Discharge: 2022-01-24 | Disposition: A | Discharge status: Home / Self Care | Payer: Medicaid Other | Attending: Emergency Medicine | Admitting: Emergency Medicine

## 2022-01-24 ENCOUNTER — Emergency Department (HOSPITAL_BASED_OUTPATIENT_CLINIC_OR_DEPARTMENT_OTHER): Payer: Medicaid Other

## 2022-01-24 DIAGNOSIS — R0602 Shortness of breath: Secondary | ICD-10-CM | POA: Diagnosis not present

## 2022-01-24 DIAGNOSIS — R519 Headache, unspecified: Secondary | ICD-10-CM | POA: Insufficient documentation

## 2022-01-24 DIAGNOSIS — R42 Dizziness and giddiness: Secondary | ICD-10-CM | POA: Insufficient documentation

## 2022-01-24 DIAGNOSIS — R002 Palpitations: Secondary | ICD-10-CM | POA: Insufficient documentation

## 2022-01-24 LAB — CBC WITH DIFFERENTIAL/PLATELET
Abs Immature Granulocytes: 0.01 10*3/uL (ref 0.00–0.07)
Basophils Absolute: 0 10*3/uL (ref 0.0–0.1)
Basophils Relative: 0 %
Eosinophils Absolute: 0.1 10*3/uL (ref 0.0–0.5)
Eosinophils Relative: 2 %
HCT: 35.7 % — ABNORMAL LOW (ref 36.0–46.0)
Hemoglobin: 11 g/dL — ABNORMAL LOW (ref 12.0–15.0)
Immature Granulocytes: 0 %
Lymphocytes Relative: 43 %
Lymphs Abs: 2.5 10*3/uL (ref 0.7–4.0)
MCH: 25.9 pg — ABNORMAL LOW (ref 26.0–34.0)
MCHC: 30.8 g/dL (ref 30.0–36.0)
MCV: 84.2 fL (ref 80.0–100.0)
Monocytes Absolute: 0.4 10*3/uL (ref 0.1–1.0)
Monocytes Relative: 8 %
Neutro Abs: 2.6 10*3/uL (ref 1.7–7.7)
Neutrophils Relative %: 47 %
Platelets: 322 10*3/uL (ref 150–400)
RBC: 4.24 MIL/uL (ref 3.87–5.11)
RDW: 13.5 % (ref 11.5–15.5)
WBC: 5.6 10*3/uL (ref 4.0–10.5)
nRBC: 0 % (ref 0.0–0.2)

## 2022-01-24 LAB — BASIC METABOLIC PANEL
Anion gap: 6 (ref 5–15)
BUN: 7 mg/dL (ref 6–20)
CO2: 26 mmol/L (ref 22–32)
Calcium: 8.7 mg/dL — ABNORMAL LOW (ref 8.9–10.3)
Chloride: 106 mmol/L (ref 98–111)
Creatinine, Ser: 0.58 mg/dL (ref 0.44–1.00)
GFR, Estimated: >60 mL/min (ref >60)
Glucose, Bld: 96 mg/dL (ref 70–99)
Potassium: 3.5 mmol/L (ref 3.5–5.1)
Sodium: 138 mmol/L (ref 135–145)

## 2022-01-24 LAB — TSH: TSH: 1.35 u[IU]/mL (ref 0.350–4.500)

## 2022-01-24 LAB — TROPONIN I (HIGH SENSITIVITY): Troponin I (High Sensitivity): <2 ng/L (ref <18)

## 2022-01-24 LAB — HCG, QUANTITATIVE, PREGNANCY: hCG, Beta Chain, Quant, S: <1 m[IU]/mL (ref <5)

## 2022-01-24 MED ORDER — DIPHENHYDRAMINE HCL 50 MG/ML IJ SOLN
25.0000 mg | Freq: Once | INTRAMUSCULAR | Status: AC
  Administered 2022-01-24: 25 mg via INTRAVENOUS
  Filled 2022-01-24: qty 1

## 2022-01-24 MED ORDER — LORAZEPAM 1 MG PO TABS
1.0000 mg | ORAL_TABLET | Freq: Once | ORAL | Status: AC
  Administered 2022-01-24: 1 mg via ORAL
  Filled 2022-01-24: qty 1

## 2022-01-24 MED ORDER — SODIUM CHLORIDE 0.9 % IV BOLUS
1000.0000 mL | Freq: Once | INTRAVENOUS | Status: AC
  Administered 2022-01-24: 1000 mL via INTRAVENOUS

## 2022-01-24 MED ORDER — METOCLOPRAMIDE HCL 5 MG/ML IJ SOLN
10.0000 mg | Freq: Once | INTRAMUSCULAR | Status: AC
  Administered 2022-01-24: 10 mg via INTRAVENOUS
  Filled 2022-01-24: qty 2

## 2022-01-24 NOTE — Discharge Instructions (Signed)
Please follow-up with your primary care doctor for your TSH results.  Drink lots of water and rest.  Take Tylenol and ibuprofen for headaches.

## 2022-01-24 NOTE — ED Provider Notes (Signed)
[facility] EMERGENCY DEPARTMENT Provider Note   CSN: [id number] Arrival date & time: 01/24/22  1824     History  Chief Complaint  Patient presents with   Headache   Shortness of Breath    Claudia Webb is a 26 y.o. female, who presents to the ED secondary to shortness of breath, palpitations, starting about an hour and a half ago.  She has a history of anxiety, she states that she may be having an anxiety attack.  Denies any chest pain.  Denies any recent stressors.  Also states that she has a headache on the right side of her head, denies any head trauma.  Notes that it just feels slightly tender.     Home Medications Prior to Admission medications   Medication Sig Start Date End Date Taking? Authorizing Provider  cephALEXin (KEFLEX) 500 MG capsule Take 1 capsule (500 mg total) by mouth 4 (four) times daily. 03/30/17   Law, Bea Graff, PA-C  HYDROcodone-acetaminophen (NORCO/VICODIN) 5-325 MG tablet Take 1-2 tablets by mouth every 6 (six) hours as needed. 03/30/17   Law, Bea Graff, PA-C  ibuprofen (ADVIL,MOTRIN) 800 MG tablet Take 1 tablet (800 mg total) by mouth 3 (three) times daily. 03/30/17   Frederica Kuster, PA-C      Allergies    Patient has no known allergies.    Review of Systems   Review of Systems  Constitutional:  Negative for chills and fever.  Respiratory:  Positive for shortness of breath. Negative for cough.   Cardiovascular:  Positive for palpitations.  Neurological:  Positive for headaches.    Physical Exam Updated Vital Signs BP 121/72 (BP Location: Left Arm)   Pulse 79   Temp 98 F (36.7 C) (Oral)   Resp 18   Ht 5\' 4"  (1.626 m)   Wt 88.5 kg   LMP 01/20/2022 (Approximate)   SpO2 100%   BMI 33.47 kg/m  Physical Exam Vitals and nursing note reviewed.  Constitutional:      General: She is not in acute distress.    Appearance: She is well-developed.  HENT:     Head: Normocephalic and atraumatic.  Eyes:     Conjunctiva/sclera:  Conjunctivae normal.  Cardiovascular:     Rate and Rhythm: Normal rate and regular rhythm.     Heart sounds: No murmur heard. Pulmonary:     Effort: Pulmonary effort is normal. No respiratory distress.     Breath sounds: Normal breath sounds.  Abdominal:     Palpations: Abdomen is soft.     Tenderness: There is no abdominal tenderness.  Musculoskeletal:        General: No swelling.     Cervical back: Neck supple.  Skin:    General: Skin is warm and dry.     Capillary Refill: Capillary refill takes less than 2 seconds.  Neurological:     Mental Status: She is alert.  Psychiatric:        Mood and Affect: Mood normal.     ED Results / Procedures / Treatments   Labs (all labs ordered are listed, but only abnormal results are displayed) Labs Reviewed  CBC WITH DIFFERENTIAL/PLATELET - Abnormal; Notable for the following components:      Result Value   Hemoglobin 11.0 (*)    HCT 35.7 (*)    MCH 25.9 (*)    All other components within normal limits  BASIC METABOLIC PANEL - Abnormal; Notable for the following components:   Calcium 8.7 (*)  All other components within normal limits  HCG, QUANTITATIVE, PREGNANCY  TSH  TROPONIN I (HIGH SENSITIVITY)    EKG EKG Interpretation  Date/Time:  Tuesday January 24 2022 18:42:09 EDT Ventricular Rate:  74 PR Interval:  144 QRS Duration: 100 QT Interval:  372 QTC Calculation: 412 R Axis:   29 Text Interpretation: Normal sinus rhythm with sinus arrhythmia Normal ECG When compared with ECG of 18-Jan-2015 19:17, PREVIOUS ECG IS PRESENT Confirmed by Octaviano Glow [id number]) on 01/24/2022 8:58:23 PM  Radiology DG Chest 1 View  Result Date: 01/24/2022 CLINICAL DATA:  Headache, shortness of breath and dizziness. EXAM: CHEST  1 VIEW COMPARISON:  Chest x-ray 02/10/2019 FINDINGS: The cardiac silhouette, mediastinal and hilar contours are normal. The lungs are clear. No pleural effusion. No pulmonary lesions. No pneumothorax. IMPRESSION: No  acute cardiopulmonary findings. Electronically Signed   By: Marijo Sanes M.D.   On: 01/24/2022 19:54    Procedures Procedures   Medications Ordered in ED Medications  sodium chloride 0.9 % bolus 1,000 mL (1,000 mLs Intravenous New Bag/Given 01/24/22 2035)  metoCLOPramide (REGLAN) injection 10 mg (10 mg Intravenous Given 01/24/22 2035)  diphenhydrAMINE (BENADRYL) injection 25 mg (25 mg Intravenous Given 01/24/22 2037)  LORazepam (ATIVAN) tablet 1 mg (1 mg Oral Given 01/24/22 2057)    ED Course/ Medical Decision Making/ A&P                           Medical Decision Making Patient is a 26 year old female, here for shortness of breath, lightheadedness for last 90 minutes.  History of anxiety, reports anxiety attacks have been similar in the past.  Also reports headache.  Is well-appearing, nonlabored respirations.  Given palpitations shortness of breath will obtain 1 troponin given low heart score, and chest x-ray.  Give Reglan, Benadryl IV fluids for headache.  Amount and/or Complexity of Data Reviewed Labs: ordered.    Details: Reviewed labs, troponin within normal limits.  Labs within normal limits.  TSH pending, patient voiced understanding that she will to follow-up with PCP for this. Radiology: ordered.    Details: Chest x-ray unremarkable. Discussion of management or test interpretation with external provider(s): Discussed with patient, symptoms align with her past anxiety attack, symptoms are relieved with Ativan.  This further aligns with anxiety attack.  Headache resolved after Reglan, Benadryl, IV fluids.  She is well-appearing, does not have any palpitations or shortness of breath at this time.  I discussed follow-up with PCP, for further evaluation if repeated incidents.  She voiced understanding, will return to the ER if she has severe chest pain, shortness of breath or dizziness.  Risk Prescription drug management.    Final Clinical Impression(s) / ED Diagnoses Final  diagnoses:  Nonintractable headache, unspecified chronicity pattern, unspecified headache type  Palpitations    Rx / DC Orders ED Discharge Orders     None         Osvaldo Shipper, PA 01/24/22 2144    Wyvonnia Dusky, MD 01/24/22 2249

## 2022-01-24 NOTE — ED Triage Notes (Signed)
Headache, shob, dizziness that started 30 minutes pta. Pt states she has a hx of anxiety and is concerned for a panic attack. Pt appears calm and acting appropriately in triage.

## 2022-02-21 ENCOUNTER — Emergency Department (HOSPITAL_BASED_OUTPATIENT_CLINIC_OR_DEPARTMENT_OTHER)
Admission: EM | Admit: 2022-02-21 | Discharge: 2022-02-21 | Disposition: A | Discharge status: Home / Self Care | Payer: Medicaid Other | Attending: Emergency Medicine | Admitting: Emergency Medicine

## 2022-02-21 ENCOUNTER — Encounter (HOSPITAL_BASED_OUTPATIENT_CLINIC_OR_DEPARTMENT_OTHER): Payer: Self-pay | Admitting: Emergency Medicine

## 2022-02-21 ENCOUNTER — Other Ambulatory Visit: Payer: Self-pay

## 2022-02-21 DIAGNOSIS — M791 Myalgia, unspecified site: Secondary | ICD-10-CM | POA: Diagnosis present

## 2022-02-21 DIAGNOSIS — U071 COVID-19: Secondary | ICD-10-CM

## 2022-02-21 HISTORY — DX: COVID-19: U07.1

## 2022-02-21 LAB — RESP PANEL BY RT-PCR (FLU A&B, COVID) ARPGX2
Influenza A by PCR: NEGATIVE
Influenza B by PCR: NEGATIVE
SARS Coronavirus 2 by RT PCR: POSITIVE — AB

## 2022-02-21 MED ORDER — IBUPROFEN 800 MG PO TABS
800.0000 mg | ORAL_TABLET | Freq: Once | ORAL | Status: AC
  Administered 2022-02-21: 800 mg via ORAL
  Filled 2022-02-21: qty 1

## 2022-02-21 NOTE — ED Provider Notes (Signed)
[facility] EMERGENCY DEPARTMENT Provider Note   CSN: [id number] Arrival date & time: 02/21/22  1459     History  Chief Complaint  Patient presents with   Generalized Body Aches    Claudia Webb is a 26 y.o. female.  HPI   26 year old female presents emergency department with complaints of generalized body ache, headache, cough, sore throat, nasal congestion since yesterday.  When probed further, headache described as sinus type pressure in the anterior face with no actual involvement or typical headache is experienced.  She notes taking DayQuil at home which has helped some.  Reports some fever this morning did defervesce without any medication.  Denies any known sick contacts.  Denies any chest pain, shortness of breath, abdominal pain, nausea, vomiting, urinary symptoms, change in bowel habits.  No significant pertinent past medical history.  Home Medications Prior to Admission medications   Medication Sig Start Date End Date Taking? Authorizing Provider  cephALEXin (KEFLEX) 500 MG capsule Take 1 capsule (500 mg total) by mouth 4 (four) times daily. 03/30/17   Law, Waylan Boga, PA-C  HYDROcodone-acetaminophen (NORCO/VICODIN) 5-325 MG tablet Take 1-2 tablets by mouth every 6 (six) hours as needed. 03/30/17   Law, Waylan Boga, PA-C  ibuprofen (ADVIL,MOTRIN) 800 MG tablet Take 1 tablet (800 mg total) by mouth 3 (three) times daily. 03/30/17   Emi Holes, PA-C      Allergies    Patient has no known allergies.    Review of Systems   Review of Systems  All other systems reviewed and are negative.   Physical Exam Updated Vital Signs BP 114/81 (BP Location: Left Arm)   Pulse 98   Temp 99.9 F (37.7 C)   Resp 17   Ht 5\' 4"  (1.626 m)   Wt 88.5 kg   LMP 02/14/2022 (Approximate)   SpO2 100%   BMI 33.47 kg/m  Physical Exam Vitals and nursing note reviewed.  Constitutional:      General: She is not in acute distress.    Appearance: She is well-developed.  HENT:      Head: Normocephalic and atraumatic.     Right Ear: Tympanic membrane normal.     Left Ear: Tympanic membrane normal.     Mouth/Throat:     Comments: Mild posterior pharyngeal erythema noted.  Uvula midline and is symmetric with phonation.  Tonsils 1+ bilaterally with no obvious exudate. Eyes:     Conjunctiva/sclera: Conjunctivae normal.  Cardiovascular:     Rate and Rhythm: Normal rate and regular rhythm.     Heart sounds: No murmur heard. Pulmonary:     Effort: Pulmonary effort is normal. No respiratory distress.     Breath sounds: Normal breath sounds. No stridor. No wheezing, rhonchi or rales.  Abdominal:     Palpations: Abdomen is soft.     Tenderness: There is no abdominal tenderness.  Musculoskeletal:        General: No swelling.     Cervical back: Neck supple. No rigidity.  Skin:    General: Skin is warm and dry.     Capillary Refill: Capillary refill takes less than 2 seconds.  Neurological:     Mental Status: She is alert.  Psychiatric:        Mood and Affect: Mood normal.     ED Results / Procedures / Treatments   Labs (all labs ordered are listed, but only abnormal results are displayed) Labs Reviewed  RESP PANEL BY RT-PCR (FLU A&B, COVID) ARPGX2 - Abnormal;  Notable for the following components:      Result Value   SARS Coronavirus 2 by RT PCR POSITIVE (*)    All other components within normal limits    EKG None  Radiology No results found.  Procedures Procedures    Medications Ordered in ED Medications  ibuprofen (ADVIL) tablet 800 mg (800 mg Oral Given 02/21/22 1537)    ED Course/ Medical Decision Making/ A&P                           Medical Decision Making Risk Prescription drug management.   This patient presents to the ED for concern of flulike symptoms, this involves an extensive number of treatment options, and is a complaint that carries with it a high risk of complications and morbidity.  The differential diagnosis includes  COVID, influenza, pneumonia, meningitis, sepsis, viral URI   Co morbidities that complicate the patient evaluation  See HPI   Additional history obtained:  Additional history obtained from EMR External records from outside source obtained and reviewed including hospital records   Lab Tests:  I Ordered, and personally interpreted labs.  The pertinent results include: Respiratory viral panel positive for coronavirus   Imaging Studies ordered:  N/a   Cardiac Monitoring: / EKG:  The patient was maintained on a cardiac monitor.  I personally viewed and interpreted the cardiac monitored which showed an underlying rhythm of: Sinus rhythm   Consultations Obtained:  N/a   Problem List / ED Course / Critical interventions / Medication management  COVID I ordered medication including ibuprofen for myalgias   Reevaluation of the patient after these medicines showed that the patient improved I have reviewed the patients home medicines and have made adjustments as needed   Social Determinants of Health:  Denies tobacco, drug use.   Test / Admission - Considered:  COVID Vitals signs within normal range and stable throughout visit. Laboratory/imaging studies significant for: See above Patient symptoms likely secondary to coronavirus infection as depicted above.  Patient recommended symptomatic therapy with Tylenol/Motrin as needed for diffuse myalgias, oral antihistamine, nasal steroid spray as well as decongestion for upper respiratory type symptoms.  Recommend reevaluation by primary care provider in 3 to 5 days.  Patient low risk with no significant past medical history so antiviral deemed unnecessary at this time.  Treatment plan discussed with the patient she knowledge understanding was agreeable to said plan. Worrisome signs and symptoms were discussed with the patient, and the patient acknowledged understanding to return to the ED if noticed. Patient was stable upon  discharge.          Final Clinical Impression(s) / ED Diagnoses Final diagnoses:  COVID    Rx / DC Orders ED Discharge Orders     None         Peter Garter, [place] 02/21/22 1816    Terrilee Files, MD 02/22/22 1112

## 2022-02-21 NOTE — ED Triage Notes (Signed)
Generalized body aches, intermittent fevers, headache and mild cough since yesterday.

## 2022-02-21 NOTE — Discharge Instructions (Signed)
You tested positive for COVID.  Take Tylenol/Motrin as needed for body aches.  Recommend oral antihistamine such as Zyrtec/Claritin, nasal steroid spray such as Flonase/Nasacort as well as decongestion as we discussed for upper respiratory type symptoms.  Recommend reevaluation by primary care in 7 days.  Please not hesitate to return to emergency department if the worrisome signs and symptoms we discussed become apparent.

## 2022-03-01 ENCOUNTER — Other Ambulatory Visit: Payer: Self-pay

## 2022-03-01 DIAGNOSIS — U071 COVID-19: Secondary | ICD-10-CM | POA: Insufficient documentation

## 2022-03-01 DIAGNOSIS — R059 Cough, unspecified: Secondary | ICD-10-CM | POA: Diagnosis present

## 2022-03-02 ENCOUNTER — Encounter (HOSPITAL_BASED_OUTPATIENT_CLINIC_OR_DEPARTMENT_OTHER): Payer: Self-pay

## 2022-03-02 ENCOUNTER — Emergency Department (HOSPITAL_BASED_OUTPATIENT_CLINIC_OR_DEPARTMENT_OTHER)
Admission: EM | Admit: 2022-03-02 | Discharge: 2022-03-02 | Disposition: A | Discharge status: Home / Self Care | Payer: Medicaid Other | Attending: Emergency Medicine | Admitting: Emergency Medicine

## 2022-03-02 DIAGNOSIS — R6889 Other general symptoms and signs: Secondary | ICD-10-CM

## 2022-03-02 LAB — RESP PANEL BY RT-PCR (FLU A&B, COVID) ARPGX2
Influenza A by PCR: POSITIVE — AB
Influenza B by PCR: NEGATIVE
SARS Coronavirus 2 by RT PCR: POSITIVE — AB

## 2022-03-02 MED ORDER — IBUPROFEN 400 MG PO TABS
600.0000 mg | ORAL_TABLET | ORAL | Status: AC
  Administered 2022-03-02: 600 mg via ORAL
  Filled 2022-03-02: qty 1

## 2022-03-02 NOTE — ED Notes (Signed)
ED Provider at bedside. 

## 2022-03-02 NOTE — ED Triage Notes (Signed)
Patient arrives POV c/o cough and flu-like sxs; pt states she tested positive for COVID last week on 02/21/22. Pt reports body aches, chills, and fever at home. Pt states highest temp was 101 F; pt took tylenol at 11 pm this evening.

## 2022-03-02 NOTE — ED Notes (Signed)
Written and verbal inst to pt  Verbalized an understanding  To home  

## 2022-03-02 NOTE — ED Provider Notes (Signed)
  [facility] EMERGENCY DEPARTMENT Provider Note   CSN: [id number] Arrival date & time: 03/01/22  2358     History  Chief Complaint  Patient presents with   cough/flu-like sxs    Claudia Webb is a 26 y.o. female.  The history is provided by the patient.  Influenza Presenting symptoms: cough, fatigue, fever and myalgias   Presenting symptoms: no diarrhea, no shortness of breath, no sore throat and no vomiting   Severity:  Moderate Onset quality:  Gradual Duration:  1 day Chronicity:  New Associated symptoms: chills   Patient presents with cough, fever, body aches for the past 24 hours.  She was at her baseline prior to this occurring.  She reports she had COVID last month but those symptoms resolved. 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     Home Medications Prior to Admission medications   Not on File      Allergies    Patient has no known allergies.    Review of Systems   Review of Systems  Constitutional:  Positive for chills, diaphoresis, fatigue and fever.  HENT:  Negative for sore throat.   Respiratory:  Positive for cough. Negative for shortness of breath.   Gastrointestinal:  Negative for diarrhea and vomiting.  Musculoskeletal:  Positive for myalgias.    Physical Exam Updated Vital Signs BP (!) 108/53 (BP Location: Right Arm)   Pulse (!) 112   Temp 98.1 F (36.7 C) (Oral)   Resp 14   Ht 1.626 m (5\' 4" )   Wt 88.5 kg   LMP 02/14/2022 (Approximate)   SpO2 97%   BMI 33.47 kg/m  Physical Exam CONSTITUTIONAL: Well developed/well nourished HEAD: Normocephalic/atraumatic EYES: EOMI/PERRL ENMT: Mucous membranes moist, uvula midline, mild erythema, no exits, no stridor NECK: supple no meningeal signs CV: S1/S2 noted, no murmurs/rubs/gallops noted LUNGS: Lungs are clear to auscultation bilaterally, no apparent distress ABDOMEN: soft, nontender NEURO: Pt is awake/alert/appropriate, moves all  extremitiesx4.  No facial droop.   EXTREMITIES: pulses normal/equal, full ROM SKIN: warm, color normal PSYCH: no abnormalities of mood noted, alert and oriented to situation  ED Results / Procedures / Treatments   Labs (all labs ordered are listed, but only abnormal results are displayed) Labs Reviewed  RESP PANEL BY RT-PCR (FLU A&B, COVID) ARPGX2    EKG None  Radiology No results found.  Procedures Procedures    Medications Ordered in ED Medications  ibuprofen (ADVIL) tablet 600 mg (600 mg Oral Given 03/02/22 0023)    ED Course/ Medical Decision Making/ A&P                           Medical Decision Making  Patient presents with flulike symptoms.  Given her occupation, and the current state of influenza in the community, this is likely influenza. I suspect is not related to her recent COVID  diagnosis Patient is otherwise healthy and appropriate outpatient management.  We discussed return precautions        Final Clinical Impression(s) / ED Diagnoses Final diagnoses:  Flu-like symptoms    Rx / DC Orders ED Discharge Orders     None         14/7/23, MD 03/02/22 [phone]

## 2022-04-10 ENCOUNTER — Emergency Department (HOSPITAL_BASED_OUTPATIENT_CLINIC_OR_DEPARTMENT_OTHER): Payer: Medicaid Other

## 2022-04-10 ENCOUNTER — Other Ambulatory Visit: Payer: Self-pay

## 2022-04-10 DIAGNOSIS — Z8616 Personal history of COVID-19: Secondary | ICD-10-CM | POA: Insufficient documentation

## 2022-04-10 DIAGNOSIS — F419 Anxiety disorder, unspecified: Secondary | ICD-10-CM | POA: Insufficient documentation

## 2022-04-10 DIAGNOSIS — I4719 Other supraventricular tachycardia: Secondary | ICD-10-CM | POA: Diagnosis not present

## 2022-04-10 DIAGNOSIS — R0789 Other chest pain: Secondary | ICD-10-CM | POA: Diagnosis present

## 2022-04-10 LAB — CBC
HCT: 37.3 % (ref 36.0–46.0)
Hemoglobin: 11.5 g/dL — ABNORMAL LOW (ref 12.0–15.0)
MCH: 26.1 pg (ref 26.0–34.0)
MCHC: 30.8 g/dL (ref 30.0–36.0)
MCV: 84.8 fL (ref 80.0–100.0)
Platelets: 380 10*3/uL (ref 150–400)
RBC: 4.4 MIL/uL (ref 3.87–5.11)
RDW: 13.9 % (ref 11.5–15.5)
WBC: 7.5 10*3/uL (ref 4.0–10.5)
nRBC: 0 % (ref 0.0–0.2)

## 2022-04-10 LAB — BASIC METABOLIC PANEL
Anion gap: 7 (ref 5–15)
BUN: 12 mg/dL (ref 6–20)
CO2: 27 mmol/L (ref 22–32)
Calcium: 8.7 mg/dL — ABNORMAL LOW (ref 8.9–10.3)
Chloride: 101 mmol/L (ref 98–111)
Creatinine, Ser: 0.75 mg/dL (ref 0.44–1.00)
GFR, Estimated: >60 mL/min (ref >60)
Glucose, Bld: 102 mg/dL — ABNORMAL HIGH (ref 70–99)
Potassium: 3.6 mmol/L (ref 3.5–5.1)
Sodium: 135 mmol/L (ref 135–145)

## 2022-04-10 NOTE — ED Triage Notes (Signed)
Pt here for L side chest pain that started approx 1 hour ago, but states it has been intermittent once a day lasting a few minutes. This time lasting longer. Pt reports hx of anxiety, states it feels similar.

## 2022-04-11 ENCOUNTER — Encounter (HOSPITAL_BASED_OUTPATIENT_CLINIC_OR_DEPARTMENT_OTHER): Payer: Self-pay | Admitting: Emergency Medicine

## 2022-04-11 ENCOUNTER — Emergency Department (HOSPITAL_BASED_OUTPATIENT_CLINIC_OR_DEPARTMENT_OTHER)
Admission: EM | Admit: 2022-04-11 | Discharge: 2022-04-11 | Disposition: A | Discharge status: Home / Self Care | Payer: Medicaid Other | Attending: Emergency Medicine | Admitting: Emergency Medicine

## 2022-04-11 DIAGNOSIS — I4719 Other supraventricular tachycardia: Secondary | ICD-10-CM

## 2022-04-11 HISTORY — DX: Anxiety disorder, unspecified: F41.9

## 2022-04-11 LAB — TROPONIN I (HIGH SENSITIVITY)
Troponin I (High Sensitivity): 3 ng/L (ref <18)
Troponin I (High Sensitivity): 3 ng/L (ref <18)

## 2022-04-11 NOTE — ED Notes (Signed)
Pt reports that her "heart feels like its beating out of my chest and I get short of breath."  Pt reports these episodes have been happening daily for the past month, sometimes "lasting an hour and some times lasting 10 minutes."  Pt reports hx of anxiety "but I don't think this is that."

## 2022-04-11 NOTE — ED Provider Notes (Signed)
[facility] Provider Note: [name], MD, FACEP  CSN: [id number] MRN: [id number] ARRIVAL: 04/10/22 at 2313 ROOM: [place]  Chest Pain   HISTORY OF PRESENT ILLNESS  04/11/22 3:41 AM Claudia Webb is a 27 y.o. 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  She is here with chest discomfort that began about 10 PM yesterday evening.  It has been intermittent.  She has difficulty describing this discomfort.  It is not a pain and is just an abnormal sensation.  It is accompanied by rapid heartbeat and shortness of breath.  It tends to cause anxiety but she is not sure if anxiety is causing the symptoms.  She has had several episodes since she has been in the ED, most recently just before I walked into the room.   Past Medical History:  Diagnosis Date   Anxiety    COVID-19 02/21/2022    No past surgical history on file.  No family history on file.  Social History   Tobacco Use   Smoking status: Never   Smokeless tobacco: Never  Vaping Use   Vaping Use: Every day  Substance Use Topics   Alcohol use: Not Currently   Drug use: No    Prior to Admission medications   Not on File    Allergies Patient has no known allergies.   REVIEW OF SYSTEMS  Negative except as noted here or in the History of Present Illness.   PHYSICAL EXAMINATION  Initial Vital Signs Blood pressure 114/70, pulse 81, temperature 98.1 F (36.7 C), temperature source Oral, resp. rate 16, height 5\' 4"  (1.626 m), weight 88.5 kg, last menstrual period 03/28/2022, SpO2 100 %, unknown if currently breastfeeding.  Examination General: Well-developed, well-nourished female in no acute distress; appearance consistent with age of record HENT: normocephalic; atraumatic Eyes: Normal appearance Neck: supple Heart: regular rate and rhythm Lungs: clear to auscultation bilaterally Abdomen: soft; nondistended; nontender; bowel sounds present Extremities: No deformity; full range of motion; pulses  normal Neurologic: Awake, alert and oriented; motor function intact in all extremities and symmetric; no facial droop Skin: Warm and dry Psychiatric: Anxious; tearful   RESULTS  Summary of this visit's results, reviewed and interpreted by myself:   EKG Interpretation  Date/Time:  Monday April 10 2022 23:31:31 EST Ventricular Rate:  79 PR Interval:  128 QRS Duration: 103 QT Interval:  361 QTC Calculation: 414 R Axis:   30 Text Interpretation: Sinus rhythm Normal ECG No significant change was found Confirmed by Shanon Rosser [phone]) on 04/11/2022 12:14:13 AM       Laboratory Studies: Results for orders placed or performed during the hospital encounter of 04/11/22 (from the past 24 hour(s))  Basic metabolic panel     Status: Abnormal   Collection Time: 04/10/22 11:36 PM  Result Value Ref Range   Sodium 135 135 - 145 mmol/L   Potassium 3.6 3.5 - 5.1 mmol/L   Chloride 101 98 - 111 mmol/L   CO2 27 22 - 32 mmol/L   Glucose, Bld 102 (H) 70 - 99 mg/dL   BUN 12 6 - 20 mg/dL   Creatinine, Ser 0.75 0.44 - 1.00 mg/dL   Calcium 8.7 (L) 8.9 - 10.3 mg/dL   GFR, Estimated >60 >60 mL/min   Anion gap 7 5 - 15  CBC     Status: Abnormal   Collection Time: 04/10/22 11:36 PM  Result Value Ref Range   WBC 7.5 4.0 - 10.5 K/uL   RBC 4.40 3.87 -  5.11 MIL/uL   Hemoglobin 11.5 (L) 12.0 - 15.0 g/dL   HCT 37.3 36.0 - 46.0 %   MCV 84.8 80.0 - 100.0 fL   MCH 26.1 26.0 - 34.0 pg   MCHC 30.8 30.0 - 36.0 g/dL   RDW 13.9 11.5 - 15.5 %   Platelets 380 150 - 400 K/uL   nRBC 0.0 0.0 - 0.2 %  Troponin I (High Sensitivity)     Status: None   Collection Time: 04/10/22 11:36 PM  Result Value Ref Range   Troponin I (High Sensitivity) 3 <18 ng/L  Troponin I (High Sensitivity)     Status: None   Collection Time: 04/11/22  4:25 AM  Result Value Ref Range   Troponin I (High Sensitivity) 3 <18 ng/L   Imaging Studies: DG Chest 2 View  Result Date: 04/10/2022 CLINICAL DATA:  Chest pain. EXAM: CHEST - 2  VIEW COMPARISON:  01/24/2022. FINDINGS: The heart size and mediastinal contours are within normal limits. Both lungs are clear. No acute osseous abnormality. IMPRESSION: No active cardiopulmonary disease. Electronically Signed   By: Brett Fairy M.D.   On: 04/10/2022 23:51    ED COURSE and MDM  Nursing notes, initial and subsequent vitals signs, including pulse oximetry, reviewed and interpreted by myself.  Vitals:   04/10/22 2326 04/10/22 2337 04/11/22 0330 04/11/22 0334  BP: 107/68  114/70   Pulse: 87  94 81  Resp: 20  16 16   Temp: 98.8 F (37.1 C)  98.1 F (36.7 C)   TempSrc:   Oral   SpO2: 100%  100% 100%  Weight:  88.5 kg    Height:  5\' 4"  (1.626 m)     Medications - No data to display  The rhythm strip was reviewed and just before I came into the room to evaluate her her heart rate had increased (sinus tachycardia) with a rate greater than 100.  This corresponds with the patient's symptomatology.  Prior to this her heart rate had been in the 80s and on my initial evaluation it was in the 90s.    4:58 AM Troponins are well within the normal limit with no increase over time.  We will refer the patient to cardiology for evaluation of her episodic tachycardia which corresponds temporally with her symptoms.  PROCEDURES  Procedures   ED DIAGNOSES     ICD-10-CM   1. Paroxysmal sinus tachycardia  I47.19 Ambulatory referral to Cardiology         Shanon Rosser, MD 04/11/22 [phone]

## 2022-05-04 DIAGNOSIS — F419 Anxiety disorder, unspecified: Secondary | ICD-10-CM | POA: Insufficient documentation

## 2022-05-11 ENCOUNTER — Ambulatory Visit: Payer: Medicaid Other | Attending: Cardiology | Admitting: Cardiology

## 2022-07-26 ENCOUNTER — Encounter: Payer: Self-pay | Admitting: Family Medicine

## 2022-07-26 ENCOUNTER — Ambulatory Visit (INDEPENDENT_AMBULATORY_CARE_PROVIDER_SITE_OTHER): Payer: Medicaid Other | Admitting: Family Medicine

## 2022-07-26 VITALS — BP 115/71 | HR 86 | Ht 64.0 in | Wt 196.0 lb

## 2022-07-26 DIAGNOSIS — Z1159 Encounter for screening for other viral diseases: Secondary | ICD-10-CM

## 2022-07-26 DIAGNOSIS — F419 Anxiety disorder, unspecified: Secondary | ICD-10-CM

## 2022-07-26 DIAGNOSIS — Z0000 Encounter for general adult medical examination without abnormal findings: Secondary | ICD-10-CM

## 2022-07-26 DIAGNOSIS — Z114 Encounter for screening for human immunodeficiency virus [HIV]: Secondary | ICD-10-CM

## 2022-07-26 DIAGNOSIS — N6459 Other signs and symptoms in breast: Secondary | ICD-10-CM

## 2022-07-26 DIAGNOSIS — R5383 Other fatigue: Secondary | ICD-10-CM

## 2022-07-26 MED ORDER — HYDROXYZINE PAMOATE 25 MG PO CAPS
25.0000 mg | ORAL_CAPSULE | Freq: Three times a day (TID) | ORAL | 0 refills | Status: DC | PRN
Start: 2022-07-26 — End: 2022-09-20

## 2022-07-26 NOTE — Progress Notes (Signed)
New Patient Office Visit  Subjective    Patient ID: Claudia Webb, female    DOB: 07/12/1995  Age: 27 y.o. MRN: [id number]  CC:  Chief Complaint  Patient presents with   Establish Care    HPI [name] presents to establish care. No recent primary care. She currently lives with her daughters (85 and 80 years old). She works as a Sales executive.    She was at [facility] ED on 04/11/22 for chest discomfort and palpitations: EKG NSR 79. She went to [facility] ED on 04/14/22 with right-sided chest discomfort radiating t her back and worse with deep breaths. EKG NSR. CXR negative. D-dimer negative. She was prescribed naproxen for musculoskeletal pain and discharged home.   Patient reports that since then she had had some similar episodes which she feels like are being triggered by anxiety. States she started having some anxiety in 2019, unsure of triggers. She denies any depression, SI/HI. She just feels she gets worked up and can't calm herself down - probably a mix of work and personal life. She tried Zoloft in 2019 but didn't like how it made her feel (no emotion). She does feel like she has panic attacks maybe every other day - lasts 10-15 minutes usually. This has been going on for the past 6 months or so. No major life changes in the past 6 months. She is open to medication and counseling. No known relatives being treated for anxiety/depression. Apart from anxiety attacks she denies any chest pain, palpitations, dyspnea, wheezing, edema, recurrent headaches, vision changes.   Reports history of genital herpes. No recent flares. She has PRN Valtrex.   Reports she often feels fatigued, especially during her periods. States she sleeps good usually, but sometimes has a hard time falling asleep. She does take naps occasionally if time allows. She is not sure if she snores. She has never woken up choking/gasping for air, or told she stops breathing in her sleep. She usually goes to bed around  10:00 pm and has to be up by 6:00 am. She eats a general diet and does not get regular exercise.   About 3 years ago she had a cyst removed from her right breast. She states the incision was right along the areola and not even noticeable. However, over the past few years there has been some swelling and discoloration that almost looks like scarring that spreads 2-3 inches out from areola, not where any incisions had been made. States sometimes it can be painful, especially during periods. No nipple changes/discharge, nodules, lumps noticed.         07/26/2022    2:36 PM  PHQ9 SCORE ONLY  PHQ-9 Total Score 5      07/26/2022    2:37 PM  GAD 7 : Generalized Anxiety Score  Nervous, Anxious, on Edge 3  Control/stop worrying 2  Worry too much - different things 1  Trouble relaxing 1  Restless 0  Easily annoyed or irritable 0  Afraid - awful might happen 1  Total GAD 7 Score 8  Anxiety Difficulty Somewhat difficult         Outpatient Encounter Medications as of 07/26/2022  Medication Sig   hydrOXYzine (VISTARIL) 25 MG capsule Take 1 capsule (25 mg total) by mouth every 8 (eight) hours as needed for anxiety.   valACYclovir (VALTREX) 500 MG tablet Take 500 mg by mouth daily.   [DISCONTINUED] naproxen (NAPROSYN) 500 MG tablet Take 500 mg by mouth 2 (two) times  daily with a meal.   No facility-administered encounter medications on file as of 07/26/2022.    Past Medical History:  Diagnosis Date   Anxiety    COVID-19 02/21/2022   Generalized anxiety disorder with panic attacks 07/19/2017   HSV infection 11/25/2019   Migraine without aura and without status migrainosus, not intractable 06/19/2017   Nexplanon removal 11/07/2015   Palpitations 08/05/2017   Thyromegaly 05/29/2017   Overview: Diffuse. Check TSH 3/19   Vitreous floater, bilateral 06/19/2017    History reviewed. No pertinent surgical history.  Family History  Problem Relation Age of Onset   Cancer Paternal  Grandfather    Diabetes Maternal Aunt     Social History   Socioeconomic History   Marital status: Single    Spouse name: Not on file   Number of children: Not on file   Years of education: Not on file   Highest education level: Not on file  Occupational History   Not on file  Tobacco Use   Smoking status: Never   Smokeless tobacco: Never  Vaping Use   Vaping Use: Every day  Substance and Sexual Activity   Alcohol use: Not Currently   Drug use: No   Sexual activity: Yes    Birth control/protection: Condom, None  Other Topics Concern   Not on file  Social History Narrative   Not on file   Social Determinants of Health   Financial Resource Strain: Not on file  Food Insecurity: Not on file  Transportation Needs: Not on file  Physical Activity: Not on file  Stress: Not on file  Social Connections: Not on file  Intimate Partner Violence: Not on file    ROS All review of systems negative except what is listed in the HPI     Objective    BP 115/71   Pulse 86   Ht 5\' 4"  (1.626 m)   Wt 196 lb (88.9 kg)   SpO2 100%   BMI 33.64 kg/m   Physical Exam Vitals reviewed.  Constitutional:      Appearance: Normal appearance.  Cardiovascular:     Rate and Rhythm: Normal rate and regular rhythm.     Pulses: Normal pulses.     Heart sounds: Normal heart sounds.  Pulmonary:     Effort: Pulmonary effort is normal.     Breath sounds: Normal breath sounds.  Chest:    Skin:    General: Skin is warm and dry.  Neurological:     Mental Status: She is alert and oriented to person, place, and time.  Psychiatric:        Mood and Affect: Mood normal.        Behavior: Behavior normal.        Thought Content: Thought content normal.        Judgment: Judgment normal.         Assessment & Plan:   Problem List Items Addressed This Visit     Anxiety - Primary    Discussed with patient. She would like to try with PRN medication first since she didn't originally like  how Zoloft made her feel a few years ago. Adding hydroxyzine PRN - medication discussed and information provided.  No SI/HI Referral placed for counseling and to consider med management if she changes her mind      Relevant Medications   hydrOXYzine (VISTARIL) 25 MG capsule   Other Relevant Orders   TSH   Ambulatory referral to Behavioral Health   Other Visit  Diagnoses     Encounter for medical examination to establish care        Fatigue, unspecified type     Labs today to further evaluate Recommended healthy diet, regular exercise, and good sleep hygiene Patient aware of signs/symptoms requiring further/urgent evaluation.    Relevant Orders   TSH   CBC with Differential/Platelet   Comprehensive metabolic panel   Iron, TIBC and Ferritin Panel   Vitamin B12   Breast complaint     Appearance similar to keloid, but given the discomfort and change in shape/size (not near incision site) we will go ahead with ultrasound    Relevant Orders   US BREAST COMPLETE UNI RIGHT INC AXILLA   Encounter for screening for HIV       Relevant Orders   HIV Antibody (routine testing w rflx)   Encounter for hepatitis C screening test for low risk patient       Relevant Orders   Hepatitis C antibody       Return for pending labs; recommend annual physical .   Clayborne Dana, NP

## 2022-07-26 NOTE — Patient Instructions (Addendum)
Starting as needed hydroxyzine for panic attacks and referral placed for counseling and possibly med management if you decide you want to try a daily medication.  Recommend healthy diet and regular exercise along with adequate sleep Ordering some labs today to check for potential causes of fatigue - thyroid, anemia, low iron/B12, etc.  Ordering a breast ultrasound to evaluate the skin changes to your right breast.   -----------------------------------------------   Thank you for choosing [facility] for your Primary Care needs. I am excited for the opportunity to partner with you to meet your health care goals. It was a pleasure meeting you today!  Information on diet, exercise, and health maintenance recommendations are listed below. This is information to help you be sure you are on track for optimal health and monitoring.   Please look over this and let us know if you have any questions or if you have completed any of the health maintenance outside of [facility] so that we can be sure your records are up to date.  ___________________________________________________________  MyChart:  For all urgent or time sensitive needs we ask that you please call the office to avoid delays. Our number is [phone]. MyChart is not constantly monitored and due to the large volume of messages a day, replies may take up to 72 business hours.  MyChart Policy: MyChart allows for you to see your visit notes, after visit summary, provider recommendations, lab and tests results, make an appointment, request refills, and contact your provider or the office for non-urgent questions or concerns. Providers are seeing patients during normal business hours and do not have built in time to review MyChart messages.  We ask that you allow a minimum of 3 business days for responses to [employer]. For this reason, please do not send urgent requests through MyChart. Please call the  office at [phone]. New and ongoing conditions may require a visit. We have virtual and in-person visits available for your convenience.  Complex MyChart concerns may require a visit. Your provider may request you schedule a virtual or in-person visit to ensure we are providing the best care possible. MyChart messages sent after 11:00 AM on Friday will not be received by the provider until Monday morning.    Lab and Test Results: You will receive your lab and test results on MyChart as soon as they are completed and results have been sent by the lab or testing facility. Due to this service, you will receive your results BEFORE your provider.  I review lab and test results each morning prior to seeing patients. Some results require collaboration with other providers to ensure you are receiving the most appropriate care. For this reason, we ask that you please allow a minimum of 3-5 business days from the time that ALL results have been received for your provider to receive and review lab and test results and contact you about these.  Most lab and test result comments from the provider will be sent through MyChart. Your provider may recommend changes to the plan of care, follow-up visits, repeat testing, ask questions, or request an office visit to discuss these results. You may reply directly to this message or call the office to provide information for the provider or set up an appointment. In some instances, you will be called with test results and recommendations. Please let us know if this is preferred and we will make note of this in your chart to provide this for  you.    If you have not heard a response to your lab or test results in 5 business days from all results returning to MyChart, please call the office to let us know. We ask that you please avoid calling prior to this time unless there is an emergent concern. Due to high call volumes, this can delay the resulting process.  After  Hours: For all non-emergency after hours needs, please call the office at [phone] and select the option to reach the on-call  service. On-call services are shared between multiple [place] offices and therefore it will not be possible to speak directly with your provider. On-call providers may provide medical advice and recommendations, but are unable to provide refills for maintenance medications.  For all emergency or urgent medical needs after normal business hours, we recommend that you seek care at the closest Urgent Care or Emergency Department to ensure appropriate treatment in a timely manner.  [facility] has a 24 hour emergency room located on the ground floor for your convenience.   Urgent Concerns During the Business Day Providers are seeing patients from 8AM to 5PM with a busy schedule and are most often not able to respond to non-urgent calls until the end of the day or the next business day. If you should have URGENT concerns during the day, please call and speak to the nurse or schedule a same day appointment so that we can address your concern without delay.   Thank you, again, for choosing me as your health care partner. I appreciate your trust and look forward to learning more about you!   Lollie Marrow Reola Calkins, DNP, FNP-C  ___________________________________________________________  Health Maintenance Recommendations Screening Testing Mammogram Every 1-2 years based on history and risk factors Starting at age 54 Pap Smear Ages 21-39 every 3 years Ages 33-65 every 5 years with HPV testing More frequent testing may be required based on results and history Colon Cancer Screening Every 1-10 years based on test performed, risk factors, and history Starting at age 38 Bone Density Screening Every 2-10 years based on history Starting at age 62 for women Recommendations for men differ based on medication usage, history, and risk factors AAA Screening One time  ultrasound Men 52-33 years old who have ever smoked Lung Cancer Screening Low Dose Lung CT every 12 months Age 56-80 years with a 20 pack-year smoking history who still smoke or who have quit within the last 15 years  Screening Labs Routine  Labs: Complete Blood Count (CBC), Complete Metabolic Panel (CMP), Cholesterol (Lipid Panel) Every 6-12 months based on history and medications May be recommended more frequently based on current conditions or previous results Hemoglobin A1c Lab Every 3-12 months based on history and previous results Starting at age 38 or earlier with diagnosis of diabetes, high cholesterol, BMI >26, and/or risk factors Frequent monitoring for patients with diabetes to ensure blood sugar control Thyroid Panel  Every 6 months based on history, symptoms, and risk factors May be repeated more often if on medication HIV One time testing for all patients 59 and older May be repeated more frequently for patients with increased risk factors or exposure Hepatitis C One time testing for all patients 59 and older May be repeated more frequently for patients with increased risk factors or exposure Gonorrhea, Chlamydia Every 12 months for all sexually active persons 13-24 years Additional monitoring may be recommended for those who are considered high risk or who have symptoms PSA Men 40-54 years  old with risk factors Additional screening may be recommended from age 49-69 based on risk factors, symptoms, and history  Vaccine Recommendations Tetanus Booster All adults every 10 years Flu Vaccine All patients 6 months and older every year COVID Vaccine All patients 12 years and older Initial dosing with booster May recommend additional booster based on age and health history HPV Vaccine 2 doses all patients age 25-26 Dosing may be considered for patients over 26 Shingles Vaccine (Shingrix) 2 doses all adults 50 years and older Pneumonia (Pneumovax 23) All adults 65  years and older May recommend earlier dosing based on health history Pneumonia (Prevnar 49) All adults 65 years and older Dosed 1 year after Pneumovax 23 Pneumonia (Prevnar 20) All adults 65 years and older (adults 19-64 with certain conditions or risk factors) 1 dose  For those who have not received Prevnar 13 vaccine previously   Additional Screening, Testing, and Vaccinations may be recommended on an individualized basis based on family history, health history, risk factors, and/or exposure.  __________________________________________________________  Diet Recommendations for All Patients  I recommend that all patients maintain a diet low in saturated fats, carbohydrates, and cholesterol. While this can be challenging at first, it is not impossible and small changes can make big differences.  Things to try: Decreasing the amount of soda, sweet tea, and/or juice to one or less per day and replace with water While water is always the first choice, if you do not like water you may consider adding a water additive without sugar to improve the taste other sugar free drinks Replace potatoes with a brightly colored vegetable  Use healthy oils, such as canola oil or olive oil, instead of butter or hard margarine Limit your bread intake to two pieces or less a day Replace regular pasta with low carb pasta options Bake, broil, or grill foods instead of frying Monitor portion sizes  Eat smaller, more frequent meals throughout the day instead of large meals  An important thing to remember is, if you love foods that are not great for your health, you don't have to give them up completely. Instead, allow these foods to be a reward when you have done well. Allowing yourself to still have special treats every once in a while is a nice way to tell yourself thank you for working hard to keep yourself healthy.   Also remember that every day is a new day. If you have a bad day and "fall off the  wagon", you can still climb right back up and keep moving along on your journey!  We have resources available to help you!  Some websites that may be helpful include: www.http://www.wall-moore.info/  Www.VeryWellFit.com _____________________________________________________________  Activity Recommendations for All Patients  I recommend that all adults get at least 20 minutes of moderate physical activity that elevates your heart rate at least 5 days out of the week.  Some examples include: Walking or jogging at a pace that allows you to carry on a conversation Cycling (stationary bike or outdoors) Water aerobics Yoga Weight lifting Dancing If physical limitations prevent you from putting stress on your joints, exercise in a pool or seated in a chair are excellent options.  Do determine your MAXIMUM heart rate for activity: 220 - YOUR AGE = MAX Heart Rate   Remember! Do not push yourself too hard.  Start slowly and build up your pace, speed, weight, time in exercise, etc.  Allow your body to rest between exercise and get good sleep. You  will need more water than normal when you are exerting yourself. Do not wait until you are thirsty to drink. Drink with a purpose of getting in at least 8, 8 ounce glasses of water a day plus more depending on how much you exercise and sweat.    If you begin to develop dizziness, chest pain, abdominal pain, jaw pain, shortness of breath, headache, vision changes, lightheadedness, or other concerning symptoms, stop the activity and allow your body to rest. If your symptoms are severe, seek emergency evaluation immediately. If your symptoms are concerning, but not severe, please let us know so that we can recommend further evaluation.

## 2022-07-26 NOTE — Assessment & Plan Note (Signed)
Discussed with patient. She would like to try with PRN medication first since she didn't originally like how Zoloft made her feel a few years ago. Adding hydroxyzine PRN - medication discussed and information provided.  No SI/HI Referral placed for counseling and to consider med management if she changes her mind

## 2022-07-27 LAB — CBC WITH DIFFERENTIAL/PLATELET
Basophils Absolute: 0.1 10*3/uL (ref 0.0–0.1)
Basophils Relative: 1 % (ref 0.0–3.0)
Eosinophils Absolute: 0.1 10*3/uL (ref 0.0–0.7)
Eosinophils Relative: 1.1 % (ref 0.0–5.0)
HCT: 36.5 % (ref 36.0–46.0)
Hemoglobin: 11.8 g/dL — ABNORMAL LOW (ref 12.0–15.0)
Lymphocytes Relative: 36.9 % (ref 12.0–46.0)
Lymphs Abs: 1.9 10*3/uL (ref 0.7–4.0)
MCHC: 32.4 g/dL (ref 30.0–36.0)
MCV: 82.3 fl (ref 78.0–100.0)
Monocytes Absolute: 0.4 10*3/uL (ref 0.1–1.0)
Monocytes Relative: 7.4 % (ref 3.0–12.0)
Neutro Abs: 2.8 10*3/uL (ref 1.4–7.7)
Neutrophils Relative %: 53.6 % (ref 43.0–77.0)
Platelets: 347 10*3/uL (ref 150.0–400.0)
RBC: 4.44 Mil/uL (ref 3.87–5.11)
RDW: 13.9 % (ref 11.5–15.5)
WBC: 5.2 10*3/uL (ref 4.0–10.5)

## 2022-07-27 LAB — IRON,TIBC AND FERRITIN PANEL
%SAT: 22 % (calc) (ref 16–45)
Ferritin: 18 ng/mL (ref 16–154)
Iron: 77 ug/dL (ref 40–190)
TIBC: 354 mcg/dL (calc) (ref 250–450)

## 2022-07-27 LAB — COMPREHENSIVE METABOLIC PANEL
ALT: 11 U/L (ref 0–35)
AST: 11 U/L (ref 0–37)
Albumin: 4.2 g/dL (ref 3.5–5.2)
Alkaline Phosphatase: 51 U/L (ref 39–117)
BUN: 10 mg/dL (ref 6–23)
CO2: 26 mEq/L (ref 19–32)
Calcium: 9.5 mg/dL (ref 8.4–10.5)
Chloride: 104 mEq/L (ref 96–112)
Creatinine, Ser: 0.72 mg/dL (ref 0.40–1.20)
GFR: 114.48 mL/min (ref >60.00)
Glucose, Bld: 89 mg/dL (ref 70–99)
Potassium: 4.2 mEq/L (ref 3.5–5.1)
Sodium: 138 mEq/L (ref 135–145)
Total Bilirubin: 0.6 mg/dL (ref 0.2–1.2)
Total Protein: 7.3 g/dL (ref 6.0–8.3)

## 2022-07-27 LAB — HIV ANTIBODY (ROUTINE TESTING W REFLEX): HIV 1&2 Ab, 4th Generation: NONREACTIVE

## 2022-07-27 LAB — TSH: TSH: 0.98 u[IU]/mL (ref 0.35–5.50)

## 2022-07-27 LAB — VITAMIN B12: Vitamin B-12: 257 pg/mL (ref 211–911)

## 2022-07-27 LAB — HEPATITIS C ANTIBODY: Hepatitis C Ab: NONREACTIVE

## 2022-09-04 ENCOUNTER — Encounter: Payer: Medicaid Other | Admitting: Family Medicine

## 2022-09-05 ENCOUNTER — Other Ambulatory Visit: Payer: Medicaid Other

## 2022-09-20 ENCOUNTER — Other Ambulatory Visit: Payer: Self-pay | Admitting: Family Medicine

## 2022-09-20 ENCOUNTER — Other Ambulatory Visit (HOSPITAL_BASED_OUTPATIENT_CLINIC_OR_DEPARTMENT_OTHER): Payer: Self-pay

## 2022-09-20 DIAGNOSIS — F419 Anxiety disorder, unspecified: Secondary | ICD-10-CM

## 2022-09-20 MED ORDER — HYDROXYZINE PAMOATE 25 MG PO CAPS: 25.0000 mg | ORAL_CAPSULE | Freq: Three times a day (TID) | ORAL | 0 refills | Status: DC | PRN

## 2022-09-20 MED ORDER — VALACYCLOVIR HCL 500 MG PO TABS
500.0000 mg | ORAL_TABLET | Freq: Every day | ORAL | 0 refills | Status: AC
  Filled 2022-09-20: qty 14, 14d supply, fill #0
  Filled 2022-12-04: qty 90, 90d supply, fill #0

## 2022-10-02 ENCOUNTER — Ambulatory Visit: Payer: Medicaid Other | Admitting: Family Medicine

## 2022-10-03 ENCOUNTER — Other Ambulatory Visit (HOSPITAL_BASED_OUTPATIENT_CLINIC_OR_DEPARTMENT_OTHER): Payer: Self-pay

## 2022-10-16 ENCOUNTER — Encounter: Payer: Medicaid Other | Admitting: Family Medicine

## 2022-12-04 ENCOUNTER — Other Ambulatory Visit (HOSPITAL_BASED_OUTPATIENT_CLINIC_OR_DEPARTMENT_OTHER): Payer: Self-pay

## 2022-12-04 ENCOUNTER — Other Ambulatory Visit: Payer: Self-pay | Admitting: Family Medicine

## 2022-12-04 DIAGNOSIS — F419 Anxiety disorder, unspecified: Secondary | ICD-10-CM

## 2022-12-04 MED ORDER — HYDROXYZINE PAMOATE 25 MG PO CAPS
25.0000 mg | ORAL_CAPSULE | Freq: Three times a day (TID) | ORAL | 0 refills | Status: DC | PRN
Start: 2022-12-04 — End: 2024-01-15

## 2022-12-08 ENCOUNTER — Other Ambulatory Visit (HOSPITAL_BASED_OUTPATIENT_CLINIC_OR_DEPARTMENT_OTHER): Payer: Self-pay

## 2022-12-08 ENCOUNTER — Encounter (HOSPITAL_BASED_OUTPATIENT_CLINIC_OR_DEPARTMENT_OTHER): Payer: Self-pay

## 2022-12-14 ENCOUNTER — Other Ambulatory Visit (HOSPITAL_BASED_OUTPATIENT_CLINIC_OR_DEPARTMENT_OTHER): Payer: Self-pay

## 2022-12-22 ENCOUNTER — Other Ambulatory Visit (HOSPITAL_BASED_OUTPATIENT_CLINIC_OR_DEPARTMENT_OTHER): Payer: Self-pay

## 2023-03-09 ENCOUNTER — Encounter: Payer: Self-pay | Admitting: Obstetrics and Gynecology

## 2023-03-09 ENCOUNTER — Other Ambulatory Visit (HOSPITAL_COMMUNITY)
Admission: RE | Admit: 2023-03-09 | Discharge: 2023-03-09 | Disposition: A | Discharge status: Home / Self Care | Payer: Medicaid Other | Source: Ambulatory Visit | Attending: Obstetrics and Gynecology | Admitting: Obstetrics and Gynecology

## 2023-03-09 ENCOUNTER — Ambulatory Visit (INDEPENDENT_AMBULATORY_CARE_PROVIDER_SITE_OTHER): Payer: Medicaid Other | Admitting: Obstetrics and Gynecology

## 2023-03-09 VITALS — BP 115/75 | HR 60 | Ht 64.0 in | Wt 199.0 lb

## 2023-03-09 DIAGNOSIS — Z1339 Encounter for screening examination for other mental health and behavioral disorders: Secondary | ICD-10-CM

## 2023-03-09 DIAGNOSIS — N926 Irregular menstruation, unspecified: Secondary | ICD-10-CM | POA: Diagnosis not present

## 2023-03-09 DIAGNOSIS — Z01419 Encounter for gynecological examination (general) (routine) without abnormal findings: Secondary | ICD-10-CM | POA: Insufficient documentation

## 2023-03-09 DIAGNOSIS — N939 Abnormal uterine and vaginal bleeding, unspecified: Secondary | ICD-10-CM

## 2023-03-09 DIAGNOSIS — Z113 Encounter for screening for infections with a predominantly sexual mode of transmission: Secondary | ICD-10-CM | POA: Diagnosis not present

## 2023-03-09 NOTE — Progress Notes (Signed)
Periods coming twice a month for about 5-6 months and lasting a few days longer.  Wants STD testing.  Last pap > 2 years.  Never had an abnormal

## 2023-03-09 NOTE — Progress Notes (Deleted)
ANNUAL EXAM Patient name: Claudia Webb MRN [id number]  Date of birth: 01/29/1996 Chief Complaint:   Annual Exam  History of Present Illness:   Claudia Webb is a 28 y.o. G2P2 being seen today for a routine annual exam.  Current complaints: ***  Menstrual concerns? {yes/no:20286}  *** Breast or nipple changes? {yes/no:20286} *** Contraception use? {yes/no:20286} *** Sexually active? {yes/no:20286} ***  Patient's last menstrual period was 03/08/2023.   The pregnancy intention screening data noted above was reviewed. Potential methods of contraception were discussed. The patient elected to proceed with No data recorded.   Last pap No results found for: "DIAGPAP", "HPVHIGH", "ADEQPAP"   H/O abnormal pap: {yes/yes***/no:23866} Last mammogram: ***.  Last colonoscopy: ***.      03/09/2023   10:36 AM 07/26/2022    2:36 PM  Depression screen PHQ 2/9  Decreased Interest 1 1  Down, Depressed, Hopeless 1 0  PHQ - 2 Score 2 1  Altered sleeping 2 1  Tired, decreased energy 2 2  Change in appetite 1 1  Feeling bad or failure about yourself  0 0  Trouble concentrating 1 0  Moving slowly or fidgety/restless 0 0  Suicidal thoughts 0 0  PHQ-9 Score 8 5  Difficult doing work/chores  Somewhat difficult        03/09/2023   10:37 AM 07/26/2022    2:37 PM  GAD 7 : Generalized Anxiety Score  Nervous, Anxious, on Edge 3 3  Control/stop worrying 2 2  Worry too much - different things 1 1  Trouble relaxing 1 1  Restless 0 0  Easily annoyed or irritable 2 0  Afraid - awful might happen 2 1  Total GAD 7 Score 11 8  Anxiety Difficulty  Somewhat difficult     Review of Systems:   Pertinent items are noted in HPI Denies any headaches, blurred vision, fatigue, shortness of breath, chest pain, abdominal pain, abnormal vaginal discharge/itching/odor/irritation, problems with periods, bowel movements, urination, or intercourse unless otherwise stated above. Pertinent History Reviewed:   Reviewed past medical,surgical, social and family history.  Reviewed problem list, medications and allergies. Physical Assessment:   Vitals:   03/09/23 1027  BP: 115/75  Pulse: 60  Weight: 199 lb (90.3 kg)  Height: 5\' 4"  (1.626 m)  Body mass index is 34.16 kg/m.        Physical Examination:   General appearance - well appearing, and in no distress  Mental status - alert, oriented to person, place, and time  Psych:  She has a normal mood and affect  Skin - warm and dry, normal color, no suspicious lesions noted  Chest - effort normal, all lung fields clear to auscultation bilaterally  Heart - normal rate and regular rhythm  Breasts - breasts appear normal, no suspicious masses, no skin or nipple changes or  axillary nodes  Abdomen - soft, nontender, nondistended, no masses or organomegaly  Pelvic -  VULVA: normal appearing vulva with no masses, tenderness or lesions   VAGINA: normal appearing vagina with normal color and discharge, no lesions   CERVIX: normal appearing cervix without discharge or lesions, no CMT  Thin prep pap is {Desc; done/not:10129} *** HR HPV cotesting  UTERUS: uterus is felt to be normal size, shape, consistency and nontender   ADNEXA: No adnexal masses or tenderness noted.  Extremities:  No swelling or varicosities noted  Chaperone present for exam  No results found for this or any previous visit (from the past 24 hours).  Assessment & Plan:  1. Well woman exam with routine gynecological exam (Primary) *** - Cytology - PAP( [place])       No orders of the defined types were placed in this encounter.   Meds: No orders of the defined types were placed in this encounter.   Follow-up: No follow-ups on file.  Lorriane Shire, MD 03/09/2023 10:40 AM

## 2023-03-09 NOTE — Progress Notes (Signed)
ANNUAL EXAM Patient name: Claudia Webb MRN [id number]  Date of birth: Oct 03, 1995 Chief Complaint:   Annual Exam  History of Present Illness:   Claudia Webb is a 27 y.o. G2P2 being seen today for a routine annual exam.  Current complaints: annual  Menstrual concerns? Yes  having menses about twice a month; having about a 21 day cycle and now a little longer in duration. Typically 3-4 day now 5-7 day, will have spotting not necessarily a full cycle She has been with her current partner x3 years  Breast or nipple changes? Yes in 2020 had an abscess and had a cyst/abscess that ws drained but is worried about "keloid" that raises or goes away. Right breast area got swollen and bursts and has some pain from it. No nipple discharge. No new headaches. Weight has been a little up and down. Occasional constipation   Was on the depo shot prior to having children; had nexplanon after first daughter, and nothing since  Contraception use? Yes  Sexually active? Yes female partner  Last baby delivered in 2016, both children were vaginal deliveries  Patient's last menstrual period was 03/08/2023.   The pregnancy intention screening data noted above was reviewed. Potential methods of contraception were discussed. The patient elected to proceed with No data recorded.   Last pap No results found for: "DIAGPAP", "HPVHIGH", "ADEQPAP" Last mammogram: none seen Last colonoscopy: n/a.      03/09/2023   10:36 AM 07/26/2022    2:36 PM  Depression screen PHQ 2/9  Decreased Interest 1 1  Down, Depressed, Hopeless 1 0  PHQ - 2 Score 2 1  Altered sleeping 2 1  Tired, decreased energy 2 2  Change in appetite 1 1  Feeling bad or failure about yourself  0 0  Trouble concentrating 1 0  Moving slowly or fidgety/restless 0 0  Suicidal thoughts 0 0  PHQ-9 Score 8 5  Difficult doing work/chores  Somewhat difficult        03/09/2023   10:37 AM 07/26/2022    2:37 PM  GAD 7 : Generalized Anxiety Score   Nervous, Anxious, on Edge 3 3  Control/stop worrying 2 2  Worry too much - different things 1 1  Trouble relaxing 1 1  Restless 0 0  Easily annoyed or irritable 2 0  Afraid - awful might happen 2 1  Total GAD 7 Score 11 8  Anxiety Difficulty  Somewhat difficult     Review of Systems:   Pertinent items are noted in HPI Denies any headaches, blurred vision, fatigue, shortness of breath, chest pain, abdominal pain, abnormal vaginal discharge/itching/odor/irritation, problems with periods, bowel movements, urination, or intercourse unless otherwise stated above. Pertinent History Reviewed:  Reviewed past medical,surgical, social and family history.  Reviewed problem list, medications and allergies. Physical Assessment:   Vitals:   03/09/23 1027  BP: 115/75  Pulse: 60  Weight: 199 lb (90.3 kg)  Height: 5\' 4"  (1.626 m)  Body mass index is 34.16 kg/m.        Physical Examination:   General appearance - well appearing, and in no distress  Mental status - alert, oriented to person, place, and time  Psych:  She has a normal mood and affect  Skin - warm and dry, normal color, no suspicious lesions noted  Chest - effort normal, all lung fields clear to auscultation bilaterally  Heart - normal rate and regular rhythm  Breasts - left breast normal, right breast with semilunar keloid scar  above right nipple with smaller curved lesion with central pink nodule just superior to areola and nipple  Abdomen - soft, nontender, nondistended, no masses or organomegaly  Pelvic -  VULVA: normal appearing vulva with no masses, tenderness or lesions   VAGINA: normal appearing vagina with normal color and discharge, no lesions   CERVIX: limited visualization, no gross lesions noted  Thin prep pap is done with HR HPV cotesting  UTERUS: uterus is felt to be normal size, shape, consistency and nontender   ADNEXA: No adnexal masses or tenderness noted.  Extremities:  No swelling or varicosities  noted  Chaperone present for exam  No results found for this or any previous visit (from the past 24 hours).    Assessment & Plan:   1. Well woman exam with routine gynecological exam (Primary) - Cervical cancer screening: Discussed guidelines. Pap with HPV collected - GC/CT: accepts - Birth Control:  female partner - Breast Health: will need to reschedule breast ultrasound. If normal ultrasound, recommend follow up with dermatology  - F/U 12 months and prn  - Cytology - PAP( [place])  2. Screening examination for STD (sexually transmitted disease)  3. Menstrual changes 4. Abnormal uterine bleeding (AUB) Reports having what appears to be shorter menstrual cycle interval with prolonged spotting. AUB labs ordered and will follow up after [place].  - CBC - Hemoglobin A1c - Prolactin - Testosterone,Free and Total - TSH Rfx on Abnormal to Free T4 - US PELVIC COMPLETE WITH TRANSVAGINAL; Future   Orders Placed This Encounter  Procedures   US PELVIC COMPLETE WITH TRANSVAGINAL   HIV antibody (with reflex)   RPR   Hepatitis C Antibody   Hepatitis B Surface AntiGEN   CBC   Hemoglobin A1c   Prolactin   Testosterone,Free and Total   TSH Rfx on Abnormal to Free T4    Meds: No orders of the defined types were placed in this encounter.   Follow-up: No follow-ups on file.  Lorriane Shire, MD 03/09/2023 10:43 AM

## 2023-03-10 LAB — HEPATITIS C ANTIBODY: Hep C Virus Ab: NONREACTIVE

## 2023-03-10 LAB — RPR: RPR Ser Ql: NONREACTIVE

## 2023-03-10 LAB — HIV ANTIBODY (ROUTINE TESTING W REFLEX): HIV Screen 4th Generation wRfx: NONREACTIVE

## 2023-03-10 LAB — HEPATITIS B SURFACE ANTIGEN: Hepatitis B Surface Ag: NEGATIVE

## 2023-03-12 ENCOUNTER — Ambulatory Visit (HOSPITAL_BASED_OUTPATIENT_CLINIC_OR_DEPARTMENT_OTHER)
Admission: RE | Admit: 2023-03-12 | Discharge: 2023-03-12 | Disposition: A | Discharge status: Home / Self Care | Payer: Medicaid Other | Source: Ambulatory Visit | Attending: Obstetrics and Gynecology | Admitting: Obstetrics and Gynecology

## 2023-03-12 ENCOUNTER — Other Ambulatory Visit: Payer: Self-pay | Admitting: Obstetrics and Gynecology

## 2023-03-12 ENCOUNTER — Encounter: Payer: Self-pay | Admitting: Obstetrics and Gynecology

## 2023-03-12 DIAGNOSIS — N939 Abnormal uterine and vaginal bleeding, unspecified: Secondary | ICD-10-CM | POA: Insufficient documentation

## 2023-03-12 DIAGNOSIS — B3731 Acute candidiasis of vulva and vagina: Secondary | ICD-10-CM

## 2023-03-12 DIAGNOSIS — N926 Irregular menstruation, unspecified: Secondary | ICD-10-CM | POA: Insufficient documentation

## 2023-03-12 LAB — CYTOLOGY - PAP
Chlamydia: NEGATIVE
Comment: NEGATIVE
Comment: NEGATIVE
Comment: NORMAL
Diagnosis: NEGATIVE
Neisseria Gonorrhea: NEGATIVE
Trichomonas: NEGATIVE

## 2023-03-12 MED ORDER — FLUCONAZOLE 150 MG PO TABS: 150.0000 mg | ORAL_TABLET | Freq: Once | ORAL | 0 refills | Status: AC

## 2023-03-16 ENCOUNTER — Encounter: Payer: Medicaid Other | Admitting: Obstetrics and Gynecology

## 2023-04-18 ENCOUNTER — Telehealth: Payer: Self-pay

## 2023-04-18 NOTE — Telephone Encounter (Signed)
Left voicemail for patient to schedule her 3 month follow up with Dr. Briscoe Deutscher.

## 2023-06-04 ENCOUNTER — Ambulatory Visit: Payer: Medicaid Other | Admitting: Obstetrics and Gynecology

## 2023-06-04 VITALS — BP 110/60 | HR 63 | Wt 190.0 lb

## 2023-06-04 DIAGNOSIS — N946 Dysmenorrhea, unspecified: Secondary | ICD-10-CM

## 2023-06-04 DIAGNOSIS — N926 Irregular menstruation, unspecified: Secondary | ICD-10-CM

## 2023-06-04 MED ORDER — NORETHIN ACE-ETH ESTRAD-FE 1-20 MG-MCG(24) PO TABS: 1.0000 | ORAL_TABLET | Freq: Every day | ORAL | 11 refills | Status: DC

## 2023-06-04 NOTE — Progress Notes (Unsigned)
    GYNECOLOGY VISIT  Patient name: Claudia Webb MRN [id number]  Date of birth: Jun 24, 1995 Chief Complaint:   No chief complaint on file.   History:  Claudia Webb is a 28 y.o. G2P2 being seen today for follow up of menstrual changes and dysmenorrhea.    Past Medical History:  Diagnosis Date   Anxiety    COVID-19 02/21/2022   Generalized anxiety disorder with panic attacks 07/19/2017   HSV infection 11/25/2019   Migraine without aura and without status migrainosus, not intractable 06/19/2017   Nexplanon removal 11/07/2015   Palpitations 08/05/2017   Thyromegaly 05/29/2017   Overview: Diffuse. Check TSH 3/19   Vitreous floater, bilateral 06/19/2017    No past surgical history on file.  The following portions of the patient's history were reviewed and updated as appropriate: allergies, current medications, past family history, past medical history, past social history, past surgical history and problem list.   Health Maintenance:   Last pap ***. Results were: {Pap findings:25134}. H/O abnormal pap: {yes/yes***/no:23866} Last mammogram: ***. Results were: {normal, abnormal, n/a:23837}. Family h/o breast cancer: {yes***/no:23838}   Review of Systems:  {Ros - complete:30496} Comprehensive review of systems was otherwise negative.   Objective:  Physical Exam BP 110/60 (BP Location: Left Arm, Patient Position: Sitting, Cuff Size: Large)   Pulse 63   Wt 190 lb (86.2 kg)   LMP 05/21/2023 (Approximate)   BMI 32.61 kg/m    Physical Exam   Labs and Imaging No results found.     Assessment & Plan:   There are no diagnoses linked to this encounter.   *** Routine preventative health maintenance measures emphasized.  Lorriane Shire, MD [facility], [facility]

## 2024-01-07 ENCOUNTER — Ambulatory Visit: Payer: Self-pay

## 2024-01-07 NOTE — Telephone Encounter (Signed)
 Reason for Triage: heart palpitation and stomach issues, constipation started recently. Looking for an appointment

## 2024-01-07 NOTE — Telephone Encounter (Signed)
Pt has been dismissed from office.

## 2024-01-07 NOTE — Telephone Encounter (Signed)
 FYI Only or Action Required?: FYI only for provider.  Patient was last seen in primary care on 07/26/2022 by Almarie Waddell NOVAK, NP.  Called Nurse Triage reporting Constipation and Palpitations.  Symptoms began several weeks ago.  Interventions attempted: Nothing.  Symptoms are: gradually worsening.  Triage Disposition: see PCP in three days  Patient/caregiver understands and will follow disposition?: Yes    Reason for Triage: heart palpitation and stomach issues, constipation started recently. Looking for an appointment       Reason for Disposition  Constipation is a chronic symptom (recurrent or ongoing AND present > 4 weeks)  [1] Palpitations AND [2] no improvement after using Care Advice  Answer Assessment - Initial Assessment Questions 1. STOOL PATTERN OR FREQUENCY: How often do you have a bowel movement (BM)?  (Normal range: 3 times a day to every 3 days)  When was your last BM?       normally once a week 2. STRAINING: Do you have to strain to have a BM?      Tries not to strain, but it is difficult to have a BM 3. ONSET: When did the constipation begin?     One week ago 4. RECTAL PAIN: Does your rectum hurt when the stool comes out? If Yes, ask: Do you have hemorrhoids? How bad is the pain?  (Scale 1-10; or mild, moderate, severe)     No pain, but does have hemorrhoids at times 5. BM COMPOSITION: Are the stools hard?      Positive for hard stools 6. BLOOD ON STOOLS: Has there been any blood on the toilet tissue or on the surface of the BM? If Yes, ask: When was the last time?     denies 7. CHRONIC CONSTIPATION: Is this a new problem for you?  If No, ask: How long have you had this problem? (days, weeks, months)      Ongoing problem  8. CHANGES IN DIET OR HYDRATION: Have there been any recent changes in your diet? How much fluids are you drinking on a daily basis?  How much have you had to drink today?     States that her diet is probably not the  best, but she is trying to drink more water 9. MEDICINES: Have you been taking any new medicines? Are you taking any narcotic pain medicines? (e.g., Dilaudid, morphine, Percocet, Vicodin)     denies 10. LAXATIVES: Have you been using any stool softeners, laxatives, or enemas?  If Yes, ask What are you using, how often, and when was the last time?       Milk of magnesia, sometimes it helps and sometimes it does not 11. ACTIVITY:  How much walking do you do every day?  Has your activity level decreased in the past week?        Often walks doe to active job 12. CAUSE: What do you think is causing the constipation?        unknown 13. MEDICAL HISTORY: Do you have a history of hemorrhoids, rectal fissures, rectal surgery, or rectal abscess?         hemorrhoids 14. OTHER SYMPTOMS: Do you have any other symptoms? (e.g., abdomen pain, bloating, fever, vomiting)       At times, bloating.  Lower right abdominal pain, occasionally 15. PREGNANCY: Is there any chance you are pregnant? When was your last menstrual period?       denies  Answer Assessment - Initial Assessment Questions 1. DESCRIPTION: Please describe your heart rate  or heartbeat that you are having (e.g., fast/slow, regular/irregular, skipped or extra beats, palpitations)     Patient feels a weird, fluttering feeling sometimes at bed time 2. ONSET: When did it start? (e.g., minutes, hours, days)      2019- onset of anxiety, palpitations x 1.5 weeks 3. DURATION: How long does it last (e.g., seconds, minutes, hours)     Sometimes lasts up to an hour making it difficult to fall asleep 4. PATTERN Does it come and go, or has it been constant since it started?  Does it get worse with exertion?   Are you feeling it now?     Comes and goes, mainly at night 7. RECURRENT SYMPTOM: Have you ever had this before? If Yes, ask: When was the last time? and What happened that time?      Occurred when first diagnosed  with anxiety, but this feels different 8. CAUSE: What do you think is causing the palpitations?     Anxiety, but not sure 9. CARDIAC HISTORY: Do you have any history of heart disease? (e.g., heart attack, angina, bypass surgery, angioplasty, arrhythmia)      None to patient's knowledge 10. OTHER SYMPTOMS: Do you have any other symptoms? (e.g., dizziness, chest pain, sweating, difficulty breathing)       Shortness of breath at times during mild activity at times.  No pattern or definitive precipitation factors 11. PREGNANCY: Is there any chance you are pregnant? When was your last menstrual period?       denies  Protocols used: Constipation-A-AH, Heart Rate and Heartbeat Questions-A-AH

## 2024-01-07 NOTE — Telephone Encounter (Signed)
 Unable to reach patient for triage x 3 attempts.

## 2024-01-09 ENCOUNTER — Ambulatory Visit: Admitting: Physician Assistant

## 2024-01-15 ENCOUNTER — Ambulatory Visit: Admitting: Physician Assistant

## 2024-01-15 VITALS — BP 129/81 | HR 81 | Ht 64.0 in | Wt 188.0 lb

## 2024-01-15 DIAGNOSIS — F411 Generalized anxiety disorder: Secondary | ICD-10-CM | POA: Diagnosis not present

## 2024-01-15 DIAGNOSIS — L509 Urticaria, unspecified: Secondary | ICD-10-CM

## 2024-01-15 DIAGNOSIS — G479 Sleep disorder, unspecified: Secondary | ICD-10-CM

## 2024-01-15 DIAGNOSIS — R4589 Other symptoms and signs involving emotional state: Secondary | ICD-10-CM

## 2024-01-15 DIAGNOSIS — R002 Palpitations: Secondary | ICD-10-CM

## 2024-01-15 DIAGNOSIS — E010 Iodine-deficiency related diffuse (endemic) goiter: Secondary | ICD-10-CM | POA: Diagnosis not present

## 2024-01-15 DIAGNOSIS — F419 Anxiety disorder, unspecified: Secondary | ICD-10-CM

## 2024-01-15 DIAGNOSIS — K581 Irritable bowel syndrome with constipation: Secondary | ICD-10-CM | POA: Insufficient documentation

## 2024-01-15 MED ORDER — ESCITALOPRAM OXALATE 5 MG PO TABS: 5.0000 mg | ORAL_TABLET | Freq: Every day | ORAL | 1 refills | Status: AC

## 2024-01-15 MED ORDER — HYDROXYZINE PAMOATE 25 MG PO CAPS: 25.0000 mg | ORAL_CAPSULE | Freq: Every day | ORAL | 1 refills | Status: AC

## 2024-01-15 NOTE — Patient Instructions (Addendum)
 Start lexapro daily in the morning and Hydroxyzine  at bedtime.  Get labs today.  Schedule u/s of thyroid  Mirlax for constipation  Constipation, Adult Constipation is when a person has trouble pooping (having a bowel movement). When you have this condition, you may poop fewer than 3 times a week. Your poop (stool) may also be dry, hard, or bigger than normal. Follow these instructions at home: Eating and drinking  Eat foods that have a lot of fiber, such as: Fresh fruits and vegetables. Whole grains. Beans. Eat less of foods that are low in fiber and high in fat and sugar, such as: [place] fries. Hamburgers. Cookies. Candy. Soda. Drink enough fluid to keep your pee (urine) pale yellow. General instructions Exercise regularly or as told by your doctor. Try to do 150 minutes of exercise each week. Go to the restroom when you feel like you need to poop. Do not hold it in. Take over-the-counter and prescription medicines only as told by your doctor. These include any fiber supplements. When you poop: Do deep breathing while relaxing your lower belly (abdomen). Relax your pelvic floor. The pelvic floor is a group of muscles that support the rectum, bladder, and intestines (as well as the uterus in women). Watch your condition for any changes. Tell your doctor if you notice any. Keep all follow-up visits as told by your doctor. This is important. Contact a doctor if: You have pain that gets worse. You have a fever. You have not pooped for 4 days. You vomit. You are not hungry. You lose weight. You are bleeding from the opening of the butt (anus). You have thin, pencil-like poop. Get help right away if: You have a fever, and your symptoms suddenly get worse. You leak poop or have blood in your poop. Your belly feels hard or bigger than normal (bloated). You have very bad belly pain. You feel dizzy or you faint. Summary Constipation is when a person poops fewer than 3 times a  week, has trouble pooping, or has poop that is dry, hard, or bigger than normal. Eat foods that have a lot of fiber. Drink enough fluid to keep your pee (urine) pale yellow. Take over-the-counter and prescription medicines only as told by your doctor. These include any fiber supplements. This information is not intended to replace advice given to you by your health care provider. Make sure you discuss any questions you have with your health care provider. Document Revised: 01/25/2022 Document Reviewed: 01/25/2022 Elsevier Patient Education  2024 [employer].

## 2024-01-16 ENCOUNTER — Encounter: Payer: Self-pay | Admitting: Physician Assistant

## 2024-01-16 ENCOUNTER — Ambulatory Visit: Payer: Self-pay | Admitting: Physician Assistant

## 2024-01-16 DIAGNOSIS — F411 Generalized anxiety disorder: Secondary | ICD-10-CM | POA: Insufficient documentation

## 2024-01-16 DIAGNOSIS — R4589 Other symptoms and signs involving emotional state: Secondary | ICD-10-CM | POA: Insufficient documentation

## 2024-01-16 DIAGNOSIS — E559 Vitamin D deficiency, unspecified: Secondary | ICD-10-CM | POA: Insufficient documentation

## 2024-01-16 DIAGNOSIS — G479 Sleep disorder, unspecified: Secondary | ICD-10-CM | POA: Insufficient documentation

## 2024-01-16 DIAGNOSIS — L509 Urticaria, unspecified: Secondary | ICD-10-CM | POA: Insufficient documentation

## 2024-01-16 NOTE — Progress Notes (Signed)
 Claudia Webb,   B12 and folate normal range.  Iron panel looks good.  Kidney, liver, glucose look good.  Thyroid  looks GREAT.  Vitamin D really low. Start 5000 units daily and recheck in 3 months.  Sed rate(inflammatory marker) is high. Will add ANA

## 2024-01-16 NOTE — Progress Notes (Signed)
 Established Patient Office Visit  Subjective   Patient ID: Claudia Webb, female    DOB: 06-15-1995  Age: 28 y.o. MRN: [id number]  Chief Complaint  Patient presents with   Medical Management of Chronic Issues    HPI Discussed the use of AI scribe software for clinical note transcription with the patient, who gave verbal consent to proceed.  History of Present Illness Claudia Webb is a 28 year old female who presents with anxiety and constipation.  Anxiety symptoms - Worsening Anxiety symptoms present for approximately two weeks, including heart fluttering, palpitations, difficulty sleeping, and restlessness - History of anxiety and office visits off and on for years - Episodes of waking up feeling unable to breathe with rapid heart rate - Hydroxyzine  used as needed for anxiety, causes sedation - No daily medication for anxiety - Previously trialed Zoloft, discontinued due to feeling 'too numb'  Constipation and gastrointestinal symptoms - Constipation without diarrhea - Occasional hemorrhoids - Increased fiber intake over the past week, resulting in improved bowel movements - No recent initiation of new medications  Urticaria - Intermittent hives, particularly when body overheats (e.g., during hot showers) - No identified specific allergies - No regular use of antihistamines    ROS See HPI.    Objective:     BP 129/81   Pulse 81   Ht 5' 4 (1.626 m)   Wt 188 lb (85.3 kg)   SpO2 99%   BMI 32.27 kg/m  BP Readings from Last 3 Encounters:  01/15/24 129/81  06/04/23 110/60  03/09/23 115/75   Wt Readings from Last 3 Encounters:  01/15/24 188 lb (85.3 kg)  06/04/23 190 lb (86.2 kg)  03/09/23 199 lb (90.3 kg)    ..    01/15/2024    3:34 PM 03/09/2023   10:36 AM 07/26/2022    2:36 PM  Depression screen PHQ 2/9  Decreased Interest 2 1 1   Down, Depressed, Hopeless 2 1 0  PHQ - 2 Score 4 2 1   Altered sleeping 2 2 1   Tired, decreased energy 3 2 2   Change in  appetite 3 1 1   Feeling bad or failure about yourself  2 0 0  Trouble concentrating 0 1 0  Moving slowly or fidgety/restless 0 0 0  Suicidal thoughts 0 0 0  PHQ-9 Score 14 8 5   Difficult doing work/chores Somewhat difficult  Somewhat difficult   ..    01/15/2024    3:34 PM 03/09/2023   10:37 AM 07/26/2022    2:37 PM  GAD 7 : Generalized Anxiety Score  Nervous, Anxious, on Edge 2 3 3   Control/stop worrying 2 2 2   Worry too much - different things 2 1 1   Trouble relaxing 1 1 1   Restless 1 0 0  Easily annoyed or irritable 1 2 0  Afraid - awful might happen 2 2 1   Total GAD 7 Score 11 11 8   Anxiety Difficulty Somewhat difficult  Somewhat difficult      Physical Exam Constitutional:      Appearance: Normal appearance.  HENT:     Head: Normocephalic.  Neck:     Comments: Enlarged bilateral thyroid  gland.  Cardiovascular:     Rate and Rhythm: Normal rate and regular rhythm.     Pulses: Normal pulses.  Pulmonary:     Effort: Pulmonary effort is normal.     Breath sounds: Normal breath sounds.  Musculoskeletal:     Cervical back: Normal range of motion and neck supple. No  tenderness.     Right lower leg: No edema.     Left lower leg: No edema.  Lymphadenopathy:     Cervical: No cervical adenopathy.  Skin:    Findings: No rash.  Neurological:     General: No focal deficit present.     Mental Status: She is alert and oriented to person, place, and time.  Psychiatric:        Mood and Affect: Mood normal.         Assessment & Plan:  SABRASABRATanielle was seen today for medical management of chronic issues.  Diagnoses and all orders for this visit:  Irritable bowel syndrome with constipation -     TSH + free T4 -     Thyroid  peroxidase antibody -     Thyroid  stimulating immunoglobulin -     Sed Rate (ESR) -     C-reactive protein -     CMP14+EGFR -     CBC w/Diff/Platelet -     Fe+TIBC+Fer -     PTH, Intact and Calcium -     B12 and Folate Panel -     VITAMIN D 25  Hydroxy (Vit-D Deficiency, Fractures)  Thyromegaly -     TSH + free T4 -     Thyroid  peroxidase antibody -     Thyroid  stimulating immunoglobulin -     US  THYROID ; Future  Hives -     TSH + free T4 -     Thyroid  peroxidase antibody -     Thyroid  stimulating immunoglobulin -     Sed Rate (ESR) -     C-reactive protein -     CMP14+EGFR -     CBC w/Diff/Platelet -     Fe+TIBC+Fer -     PTH, Intact and Calcium -     B12 and Folate Panel -     VITAMIN D 25 Hydroxy (Vit-D Deficiency, Fractures)  GAD (generalized anxiety disorder) -     TSH + free T4 -     Thyroid  peroxidase antibody -     Thyroid  stimulating immunoglobulin -     Sed Rate (ESR) -     C-reactive protein -     CMP14+EGFR -     CBC w/Diff/Platelet -     Fe+TIBC+Fer -     PTH, Intact and Calcium -     B12 and Folate Panel -     VITAMIN D 25 Hydroxy (Vit-D Deficiency, Fractures) -     escitalopram (LEXAPRO) 5 MG tablet; Take 1 tablet (5 mg total) by mouth daily.  Palpitations -     TSH + free T4 -     Thyroid  peroxidase antibody -     Thyroid  stimulating immunoglobulin -     Sed Rate (ESR) -     C-reactive protein -     CMP14+EGFR -     CBC w/Diff/Platelet -     Fe+TIBC+Fer -     PTH, Intact and Calcium -     B12 and Folate Panel -     VITAMIN D 25 Hydroxy (Vit-D Deficiency, Fractures)  Trouble in sleeping -     hydrOXYzine  (VISTARIL ) 25 MG capsule; Take 1 capsule (25 mg total) by mouth at bedtime.  Depressed mood    Assessment & Plan Anxiety disorder/Depression PHQ/GAD not to goal Symptoms include heart palpitations, restlessness, and difficulty sleeping. Hydroxyzine  causes sleepiness. Zoloft was not tolerated. Consider SSRIs or Buspar. Emphasized trial and error in medication selection and  importance of treatment. - Consider starting an SSRI such as Lexapro or Celexa. Sent lexpro 5mg  to start.  - Advise taking hydroxyzine  at bedtime to aid sleep.  Constipation and irritable bowel syndrome with  constipation Constipation without diarrhea, possibly anxiety-related. Fiber intake improved symptoms. Occasional hemorrhoids. Discussed magnesium and Miralax for management. Emphasized anxiety management and normal labs for resolution. - Advise increasing fiber intake. - Recommend magnesium at night. - Suggest using Miralax as needed, mixed in a beverage every other day.  Enlarged thyroid  Enlarged thyroid  noted. Previous thyroid  panel in 2024. Discussed thyroid 's role in metabolism and symptoms. Plan to reassess thyroid  function and structure. - Order thyroid  ultrasound. - Review previous thyroid  panel results.  Urticaria (hives) Intermittent hives possibly stress-related. Symptoms include itching and hives after hot showers. No known allergies. Discussed Zyrtec for stress response management. - Recommend taking Zyrtec to manage hives. - checking liver and kidney function as well as thyroid  to look for any metabolic factors for hives and itching  Pt declined flu and covid vaccine today   Follow up in 4-6 weeks on medication for anxiety.      Decarlo Rivet, PA-C

## 2024-01-17 ENCOUNTER — Other Ambulatory Visit: Payer: Self-pay

## 2024-01-17 DIAGNOSIS — R700 Elevated erythrocyte sedimentation rate: Secondary | ICD-10-CM

## 2024-01-17 LAB — CMP14+EGFR
ALT: 9 IU/L (ref 0–32)
AST: 15 IU/L (ref 0–40)
Albumin: 4.5 g/dL (ref 4.0–5.0)
Alkaline Phosphatase: 61 IU/L (ref 41–116)
BUN/Creatinine Ratio: 13 (ref 9–23)
BUN: 8 mg/dL (ref 6–20)
Bilirubin Total: 0.7 mg/dL (ref 0.0–1.2)
CO2: 23 mmol/L (ref 20–29)
Calcium: 9.2 mg/dL (ref 8.7–10.2)
Chloride: 105 mmol/L (ref 96–106)
Creatinine, Ser: 0.63 mg/dL (ref 0.57–1.00)
Globulin, Total: 2.7 g/dL (ref 1.5–4.5)
Glucose: 88 mg/dL (ref 70–99)
Potassium: 4.2 mmol/L (ref 3.5–5.2)
Sodium: 137 mmol/L (ref 134–144)
Total Protein: 7.2 g/dL (ref 6.0–8.5)
eGFR: 123 mL/min/1.73 (ref >59)

## 2024-01-17 LAB — TSH+FREE T4
Free T4: 1 ng/dL (ref 0.82–1.77)
TSH: 1.59 u[IU]/mL (ref 0.450–4.500)

## 2024-01-17 LAB — IRON,TIBC AND FERRITIN PANEL
Ferritin: 35 ng/mL (ref 15–150)
Iron Saturation: 29 % (ref 15–55)
Iron: 92 ug/dL (ref 27–159)
Total Iron Binding Capacity: 322 ug/dL (ref 250–450)
UIBC: 230 ug/dL (ref 131–425)

## 2024-01-17 LAB — THYROID PEROXIDASE ANTIBODY: Thyroperoxidase Ab SerPl-aCnc: <9 [IU]/mL (ref 0–34)

## 2024-01-17 LAB — PTH, INTACT AND CALCIUM: PTH: 45 pg/mL (ref 15–65)

## 2024-01-17 LAB — VITAMIN D 25 HYDROXY (VIT D DEFICIENCY, FRACTURES): Vit D, 25-Hydroxy: 9.5 ng/mL — ABNORMAL LOW (ref 30.0–100.0)

## 2024-01-17 LAB — CBC WITH DIFFERENTIAL/PLATELET
Basophils Absolute: 0 x10E3/uL (ref 0.0–0.2)
Basos: 1 %
EOS (ABSOLUTE): 0.1 x10E3/uL (ref 0.0–0.4)
Eos: 2 %
Hematocrit: 37.1 % (ref 34.0–46.6)
Hemoglobin: 11.6 g/dL (ref 11.1–15.9)
Immature Grans (Abs): 0 x10E3/uL (ref 0.0–0.1)
Immature Granulocytes: 0 %
Lymphocytes Absolute: 1.8 x10E3/uL (ref 0.7–3.1)
Lymphs: 40 %
MCH: 26.1 pg — ABNORMAL LOW (ref 26.6–33.0)
MCHC: 31.3 g/dL — ABNORMAL LOW (ref 31.5–35.7)
MCV: 83 fL (ref 79–97)
Monocytes Absolute: 0.4 x10E3/uL (ref 0.1–0.9)
Monocytes: 9 %
Neutrophils Absolute: 2.2 x10E3/uL (ref 1.4–7.0)
Neutrophils: 48 %
Platelets: 328 x10E3/uL (ref 150–450)
RBC: 4.45 x10E6/uL (ref 3.77–5.28)
RDW: 12.6 % (ref 11.7–15.4)
WBC: 4.5 x10E3/uL (ref 3.4–10.8)

## 2024-01-17 LAB — B12 AND FOLATE PANEL
Folate: 10.3 ng/mL (ref >3.0)
Vitamin B-12: 422 pg/mL (ref 232–1245)

## 2024-01-17 LAB — SEDIMENTATION RATE: Sed Rate: 87 mm/h — ABNORMAL HIGH (ref 0–32)

## 2024-01-17 LAB — THYROID STIMULATING IMMUNOGLOBULIN: Thyroid Stim Immunoglobulin: <0.1 IU/L (ref 0.00–0.55)

## 2024-01-17 LAB — C-REACTIVE PROTEIN: CRP: 8 mg/L (ref 0–10)

## 2024-01-17 NOTE — Telephone Encounter (Signed)
  Last read by Jackson Butts at 11:07AM on 01/17/2024.

## 2024-01-17 NOTE — Telephone Encounter (Signed)
 Added ANA test # [id number]  order with DX code R70.0 to existing blood in [facility]

## 2024-01-18 ENCOUNTER — Other Ambulatory Visit

## 2024-01-19 LAB — ANA: ANA Titer 1: NEGATIVE

## 2024-01-19 LAB — SPECIMEN STATUS REPORT

## 2024-01-21 NOTE — Progress Notes (Signed)
 ANA was negative. No antibodies detected.

## 2024-01-22 ENCOUNTER — Ambulatory Visit

## 2024-01-22 DIAGNOSIS — E010 Iodine-deficiency related diffuse (endemic) goiter: Secondary | ICD-10-CM

## 2024-01-22 DIAGNOSIS — E049 Nontoxic goiter, unspecified: Secondary | ICD-10-CM | POA: Diagnosis not present

## 2024-01-23 LAB — LAB REPORT - SCANNED

## 2024-01-23 NOTE — Progress Notes (Signed)
 Claudia Webb,   Thyroid  u/s looks good.  Reactive submandibular lymph nodes enlarged likely due to infection or inflammation.

## 2024-02-06 LAB — LAB REPORT - SCANNED

## 2024-02-06 LAB — GC/CHLAMYDIA PROBE AMP: Chlamydia, Swab/Urine, PCR: NEGATIVE
